# Patient Record
Sex: Male | Born: 1985 | Race: Black or African American | Hispanic: No | Marital: Single | Smoking: Current every day smoker
Health system: Southern US, Community
[De-identification: ages and names within clinical notes are randomized; demographics above are authoritative.]

---

## 2007-02-07 ENCOUNTER — Emergency Department (HOSPITAL_COMMUNITY): Admission: EM | Admit: 2007-02-07 | Discharge: 2007-02-07 | Payer: Self-pay | Admitting: Emergency Medicine

## 2010-03-05 ENCOUNTER — Emergency Department (HOSPITAL_COMMUNITY): Admission: EM | Admit: 2010-03-05 | Discharge: 2010-03-05 | Payer: Self-pay | Admitting: Emergency Medicine

## 2010-11-09 ENCOUNTER — Emergency Department (HOSPITAL_COMMUNITY)
Admission: EM | Admit: 2010-11-09 | Discharge: 2010-11-09 | Payer: Self-pay | Source: Home / Self Care | Admitting: Emergency Medicine

## 2010-11-17 ENCOUNTER — Emergency Department (HOSPITAL_COMMUNITY)
Admission: EM | Admit: 2010-11-17 | Discharge: 2010-11-17 | Payer: Self-pay | Source: Home / Self Care | Admitting: Emergency Medicine

## 2010-12-21 ENCOUNTER — Emergency Department (HOSPITAL_COMMUNITY)
Admission: EM | Admit: 2010-12-21 | Discharge: 2010-12-22 | Payer: Self-pay | Source: Home / Self Care | Admitting: Emergency Medicine

## 2010-12-22 LAB — GC/CHLAMYDIA PROBE AMP, GENITAL: Chlamydia, DNA Probe: NEGATIVE

## 2011-02-07 LAB — URINALYSIS, ROUTINE W REFLEX MICROSCOPIC
Bilirubin Urine: NEGATIVE
Glucose, UA: NEGATIVE mg/dL
Ketones, ur: NEGATIVE mg/dL
Nitrite: NEGATIVE
Specific Gravity, Urine: 1.024 (ref 1.005–1.030)
pH: 7.5 (ref 5.0–8.0)

## 2011-02-07 LAB — GC/CHLAMYDIA PROBE AMP, GENITAL: Chlamydia, DNA Probe: POSITIVE — AB

## 2011-06-20 ENCOUNTER — Observation Stay (HOSPITAL_COMMUNITY)
Admission: EM | Admit: 2011-06-20 | Discharge: 2011-06-20 | DRG: 040 | Disposition: A | Discharge status: Home / Self Care | Payer: BC Managed Care – PPO | Attending: Ophthalmology | Admitting: Ophthalmology

## 2011-06-20 ENCOUNTER — Emergency Department (HOSPITAL_COMMUNITY): Payer: BC Managed Care – PPO

## 2011-06-20 DIAGNOSIS — F1510 Other stimulant abuse, uncomplicated: Secondary | ICD-10-CM | POA: Insufficient documentation

## 2011-06-20 DIAGNOSIS — Y998 Other external cause status: Secondary | ICD-10-CM | POA: Insufficient documentation

## 2011-06-20 DIAGNOSIS — Y9229 Other specified public building as the place of occurrence of the external cause: Secondary | ICD-10-CM | POA: Insufficient documentation

## 2011-06-20 DIAGNOSIS — S01119A Laceration without foreign body of unspecified eyelid and periocular area, initial encounter: Principal | ICD-10-CM | POA: Insufficient documentation

## 2011-06-20 DIAGNOSIS — F17200 Nicotine dependence, unspecified, uncomplicated: Secondary | ICD-10-CM | POA: Insufficient documentation

## 2011-06-20 DIAGNOSIS — F1010 Alcohol abuse, uncomplicated: Secondary | ICD-10-CM | POA: Insufficient documentation

## 2011-06-20 DIAGNOSIS — Z01812 Encounter for preprocedural laboratory examination: Secondary | ICD-10-CM | POA: Insufficient documentation

## 2011-06-20 LAB — BASIC METABOLIC PANEL
Chloride: 107 mEq/L (ref 96–112)
GFR calc Af Amer: >60 mL/min (ref >60)
GFR calc non Af Amer: >60 mL/min (ref >60)
Glucose, Bld: 123 mg/dL — ABNORMAL HIGH (ref 70–99)
Potassium: 4 mEq/L (ref 3.5–5.1)
Sodium: 144 mEq/L (ref 135–145)

## 2011-06-20 LAB — CBC
HCT: 44.6 % (ref 39.0–52.0)
MCV: 84.3 fL (ref 78.0–100.0)
Platelets: 262 10*3/uL (ref 150–400)
RBC: 5.29 MIL/uL (ref 4.22–5.81)
RDW: 12.4 % (ref 11.5–15.5)
WBC: 18.7 10*3/uL — ABNORMAL HIGH (ref 4.0–10.5)

## 2011-06-20 LAB — PROTIME-INR: INR: 0.99 (ref 0.00–1.49)

## 2011-06-24 ENCOUNTER — Emergency Department (HOSPITAL_COMMUNITY)
Admission: EM | Admit: 2011-06-24 | Discharge: 2011-06-24 | Disposition: A | Discharge status: Home / Self Care | Payer: BC Managed Care – PPO | Attending: Emergency Medicine | Admitting: Emergency Medicine

## 2011-06-24 DIAGNOSIS — Z09 Encounter for follow-up examination after completed treatment for conditions other than malignant neoplasm: Secondary | ICD-10-CM | POA: Insufficient documentation

## 2011-06-24 DIAGNOSIS — H5789 Other specified disorders of eye and adnexa: Secondary | ICD-10-CM | POA: Insufficient documentation

## 2011-06-24 DIAGNOSIS — S01119A Laceration without foreign body of unspecified eyelid and periocular area, initial encounter: Secondary | ICD-10-CM | POA: Insufficient documentation

## 2011-06-24 DIAGNOSIS — X58XXXA Exposure to other specified factors, initial encounter: Secondary | ICD-10-CM | POA: Insufficient documentation

## 2011-06-29 NOTE — Op Note (Signed)
  NAME:  Lee Stark, Lee Stark NO.:  [id number]  MEDICAL RECORD NO.:  [id number]  LOCATION:  2550                         FACILITY:  [facility]  PHYSICIAN:  Shade Flood, MD       DATE OF BIRTH:  18-Jul-1986  DATE OF PROCEDURE:  06/20/2011 DATE OF DISCHARGE:                              OPERATIVE REPORT   PREOPERATIVE DIAGNOSIS:  Laceration of tarsal plate, right upper lid.  PROCEDURE PERFORMED:  Repair of tarsus and upper lid.  SURGEON:  Shade Flood, MD.  ESTIMATED BLOOD LOSS:  Less than 1 mL.  There were no complications.  No specimens for pathology.  The patient was prepared and draped in the usual fashion for ocular surgery on the right eye.  The anatomy of the lid was identified.  The levator aponeurosis was still attached to the superior margin of the tarsal plate, though there had been a shear-like tear through orbicularis with a full-length disruption of the tarsal plate at its medial aspect.  Interrupted 6-0 plain gut was used to reattach the tarsal plate and then orbicularis muscles were sewn together using a 6-0 plain gut.  After completing the orbicularis repair, subcutaneous tissue was then reapproximated using a mattress suture approach which was subcuticular and then the epithelium reapproximated nicely without surface on sutures.  The lid margin was sewn with one 6-0 nylon suture. The skin apposition was excellent, so I elected to use Steri-Strips for the superficial lid skin reapproximation.  The patient was given 100 mg of Ancef in the subconjunctival space under the upper lid and the patient's eye was patched using polymyxin and bacitracin ophthalmic ointment.  A plastic shield was placed and he was uneventfully extubated with stable vital signs and turned to the postoperative recovery area.          ______________________________ Shade Flood, MD     GG/MEDQ  D:  06/20/2011  T:  06/21/2011  Job:  [id number]  Electronically Signed by Shade Flood MD on 06/29/2011 11:58:51 AM

## 2012-05-01 ENCOUNTER — Encounter (HOSPITAL_COMMUNITY): Payer: Self-pay | Admitting: *Deleted

## 2012-05-01 ENCOUNTER — Emergency Department (HOSPITAL_COMMUNITY)
Admission: EM | Admit: 2012-05-01 | Discharge: 2012-05-01 | Disposition: A | Discharge status: Home / Self Care | Payer: BC Managed Care – PPO | Attending: Emergency Medicine | Admitting: Emergency Medicine

## 2012-05-01 DIAGNOSIS — F17200 Nicotine dependence, unspecified, uncomplicated: Secondary | ICD-10-CM | POA: Insufficient documentation

## 2012-05-01 DIAGNOSIS — R319 Hematuria, unspecified: Secondary | ICD-10-CM

## 2012-05-01 DIAGNOSIS — R109 Unspecified abdominal pain: Secondary | ICD-10-CM | POA: Insufficient documentation

## 2012-05-01 DIAGNOSIS — M549 Dorsalgia, unspecified: Secondary | ICD-10-CM

## 2012-05-01 LAB — URINALYSIS, ROUTINE W REFLEX MICROSCOPIC
Ketones, ur: 15 mg/dL — AB
Leukocytes, UA: NEGATIVE
Nitrite: NEGATIVE
Protein, ur: NEGATIVE mg/dL

## 2012-05-01 LAB — URINE MICROSCOPIC-ADD ON

## 2012-05-01 NOTE — ED Notes (Signed)
Pt d/c home in NAD. PT voiced understanding of d/c instructions and follow up care. Pt ambulated with quick, steady gait.  

## 2012-05-01 NOTE — Discharge Instructions (Signed)
Back Pain, Adult Low back pain is very common. About 1 in 5 people have back pain.The cause of low back pain is rarely dangerous. The pain often gets better over time.About half of people with a sudden onset of back pain feel better in just 2 weeks. About 8 in 10 people feel better by 6 weeks.  CAUSES Some common causes of back pain include:  Strain of the muscles or ligaments supporting the spine.   Wear and tear (degeneration) of the spinal discs.   Arthritis.   Direct injury to the back.  DIAGNOSIS Most of the time, the direct cause of low back pain is not known.However, back pain can be treated effectively even when the exact cause of the pain is unknown.Answering your caregiver's questions about your overall health and symptoms is one of the most accurate ways to make sure the cause of your pain is not dangerous. If your caregiver needs more information, he or she may order lab work or imaging tests (X-rays or MRIs).However, even if imaging tests show changes in your back, this usually does not require surgery. HOME CARE INSTRUCTIONS For many people, back pain returns.Since low back pain is rarely dangerous, it is often a condition that people can learn to manageon their own.   Remain active. It is stressful on the back to sit or stand in one place. Do not sit, drive, or stand in one place for more than 30 minutes at a time. Take short walks on level surfaces as soon as pain allows.Try to increase the length of time you walk each day.   Do not stay in bed.Resting more than 1 or 2 days can delay your recovery.   Do not avoid exercise or work.Your body is made to move.It is not dangerous to be active, even though your back may hurt.Your back will likely heal faster if you return to being active before your pain is gone.   Pay attention to your body when you bend and lift. Many people have less discomfortwhen lifting if they bend their knees, keep the load close to their  bodies,and avoid twisting. Often, the most comfortable positions are those that put less stress on your recovering back.   Find a comfortable position to sleep. Use a firm mattress and lie on your side with your knees slightly bent. If you lie on your back, put a pillow under your knees.   Only take over-the-counter or prescription medicines as directed by your caregiver. Over-the-counter medicines to reduce pain and inflammation are often the most helpful.Your caregiver may prescribe muscle relaxant drugs.These medicines help dull your pain so you can more quickly return to your normal activities and healthy exercise.   Put ice on the injured area.   Put ice in a plastic bag.   Place a towel between your skin and the bag.   Leave the ice on for 15 to 20 minutes, 3 to 4 times a day for the first 2 to 3 days. After that, ice and heat may be alternated to reduce pain and spasms.   Ask your caregiver about trying back exercises and gentle massage. This may be of some benefit.   Avoid feeling anxious or stressed.Stress increases muscle tension and can worsen back pain.It is important to recognize when you are anxious or stressed and learn ways to manage it.Exercise is a great option.  SEEK MEDICAL CARE IF:  You have pain that is not relieved with rest or medicine.   You have   pain that does not improve in 1 week.   You have new symptoms.   You are generally not feeling well.  SEEK IMMEDIATE MEDICAL CARE IF:   You have pain that radiates from your back into your legs.   You develop new bowel or bladder control problems.   You have unusual weakness or numbness in your arms or legs.   You develop nausea or vomiting.   You develop abdominal pain.   You feel faint.  Document Released: 11/14/2005 Document Revised: 11/03/2011 Document Reviewed: 04/04/2011 [employer] Patient Information 2012 [employer]. 

## 2012-05-01 NOTE — ED Notes (Signed)
Pt reports left sided flank pain that started approx 4-5 days ago, pt states noticed blood in urine this am. Pt in no distress, playing with phone. Reports very mild intermittent left flank pain.

## 2012-05-01 NOTE — ED Provider Notes (Addendum)
History  Scribed for [employer]. Bernette Mayers, MD, the patient was seen in room STRE5/STRE5. This chart was scribed by Candelaria Stagers. The patient's care started at 2:20 PM    CSN: [id number]  Arrival date & time 05/01/12  1320   First MD Initiated Contact with Patient 05/01/12 1359      Chief Complaint  Patient presents with  . Flank Pain  . Hematuria     HPI Lee Stark is a 26 y.o. male who presents to the Emergency Department complaining of sharp intermittent flank pain on the left side that started about four days ago.  Pt states that he also noticed blood on his penis this morning.  He denies dysuria or blood in urine.  Nothing seems to make the pain better or worse.    History reviewed. No pertinent past medical history.  History reviewed. No pertinent past surgical history.  History reviewed. No pertinent family history.  History  Substance Use Topics  . Smoking status: Current Everyday Smoker  . Smokeless tobacco: Not on file  . Alcohol Use: Yes     occ      Review of Systems  Gastrointestinal: Negative for abdominal pain.  Genitourinary: Positive for frequency, flank pain (left side) and discharge. Negative for dysuria and hematuria.  All other systems reviewed and are negative.    Allergies  Review of patient's allergies indicates no known allergies.  Home Medications  No current outpatient prescriptions on file.  BP 137/68  Pulse 78  Temp(Src) 98.4 F (36.9 C) (Oral)  Resp 20  SpO2 99%  Physical Exam  Nursing note and vitals reviewed. Constitutional: He is oriented to person, place, and time. He appears well-developed and well-nourished. No distress.  HENT:  Head: Normocephalic and atraumatic.  Eyes: EOM are normal. Pupils are equal, round, and reactive to light.  Neck: Neck supple. No tracheal deviation present.  Cardiovascular: Normal rate.   Pulmonary/Chest: Effort normal. No respiratory distress.  Abdominal: Soft. He exhibits no distension.         No CVA tenderness  Genitourinary:       Circumcised, No penile discharge, no blood at meatus. There is a small linear abrasion on the shaft of the penis that the patient states is from recent sex.   Musculoskeletal: Normal range of motion. He exhibits no edema.  Neurological: He is alert and oriented to person, place, and time.  Skin: Skin is warm and dry.  Psychiatric: He has a normal mood and affect. His behavior is normal.    ED Course  Procedures   DIAGNOSTIC STUDIES: Oxygen Saturation is 99% on room air, normal by my interpretation.    COORDINATION OF CARE:  2:51PM Ordered: Urinalysis, Routine w reflex microscopic     Labs Reviewed  URINALYSIS, ROUTINE W REFLEX MICROSCOPIC   No results found.   No diagnosis found.    MDM  L flank pain and reported hematuria. Pt is pain free now. Will check UA. GC/C probe for urine as well. Possibly a kidney stone vs urethritis.   I personally performed the services described in the documentation, which were scribed in my presence. The recorded information has been reviewed and considered.     3:16 PM  UA neg for infection. Small blood. Doubt large kidney stone. Pt states needs to leave. Advised we would call him if GC/C is positive.    Meridith Romick B. Bernette Mayers, MD 05/01/12 1455  Ninetta Adelstein B. Bernette Mayers, MD 05/01/12 1517
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
## 2012-09-03 ENCOUNTER — Emergency Department (HOSPITAL_COMMUNITY)
Admission: EM | Admit: 2012-09-03 | Discharge: 2012-09-03 | Disposition: A | Discharge status: Home / Self Care | Payer: Self-pay | Attending: Emergency Medicine | Admitting: Emergency Medicine

## 2012-09-03 ENCOUNTER — Encounter (HOSPITAL_COMMUNITY): Payer: Self-pay | Admitting: *Deleted

## 2012-09-03 DIAGNOSIS — R369 Urethral discharge, unspecified: Secondary | ICD-10-CM

## 2012-09-03 DIAGNOSIS — F17200 Nicotine dependence, unspecified, uncomplicated: Secondary | ICD-10-CM | POA: Insufficient documentation

## 2012-09-03 MED ORDER — CEFTRIAXONE SODIUM 250 MG IJ SOLR
250.0000 mg | Freq: Once | INTRAMUSCULAR | Status: AC
  Administered 2012-09-03: 250 mg via INTRAMUSCULAR
  Filled 2012-09-03: qty 250

## 2012-09-03 MED ORDER — AZITHROMYCIN 250 MG PO TABS
1000.0000 mg | ORAL_TABLET | Freq: Once | ORAL | Status: AC
  Administered 2012-09-03: 1000 mg via ORAL
  Filled 2012-09-03: qty 4

## 2012-09-03 NOTE — ED Provider Notes (Signed)
History   This chart was scribed for No att. providers found by Lynelle Smoke. The patient was seen in room TR05C/TR05C. Patient's care was started at 9:05PM.   CSN: [id number]  Arrival date & time 09/03/12  1737   None     Chief Complaint  Patient presents with  . Exposure to STD     The history is provided by the patient. No language interpreter was used.   Lee Stark is a 26 y.o. male who presents to the Emergency Department complaining of a cloudy discharge coming from the penis since yesterday. He reports of having unprotected sex with a new partner 4 days ago. He denies having prior episodes of same symptoms. He denies dysuria, penial pain, testicular pain, and rash.  History reviewed. No pertinent past medical history.  History reviewed. No pertinent past surgical history.  History reviewed. No pertinent family history.  History  Substance Use Topics  . Smoking status: Current Every Day Smoker  . Smokeless tobacco: Not on file  . Alcohol Use: Yes     occ      Review of Systems  Constitutional: Negative for fever and chills.  Gastrointestinal: Negative for nausea and vomiting.  Genitourinary: Positive for dysuria and difficulty urinating.    Allergies  Review of patient's allergies indicates no known allergies.  Home Medications  No current outpatient prescriptions on file.  Triage Vitals: BP 125/70  Pulse 93  Temp 98 F (36.7 C) (Oral)  Resp 16  SpO2 98%  Physical Exam  Nursing note and vitals reviewed. Constitutional: He is oriented to person, place, and time. He appears well-developed and well-nourished. No distress.  HENT:  Head: Normocephalic and atraumatic.  Eyes: EOM are normal.  Neck: Neck supple. No tracheal deviation present.  Cardiovascular: Normal rate.   Pulmonary/Chest: Effort normal. No respiratory distress.  Genitourinary:       No rash noted on penis or testicles. No discharge noticed. Specimen taken.  Musculoskeletal: Normal  range of motion.  Neurological: He is alert and oriented to person, place, and time.  Skin: Skin is warm and dry.  Psychiatric: He has a normal mood and affect. His behavior is normal.    ED Course  Procedures (including critical care time) DIAGNOSTIC STUDIES: Oxygen Saturation is 98% on room air, normal by my interpretation.    COORDINATION OF CARE: 9:05PM- Patient  informed of clinical course, understand medical decision-making process, and agree with plan.      Labs Reviewed  GC/CHLAMYDIA PROBE AMP, GENITAL - Abnormal; Notable for the following:    Chlamydia, DNA Probe POSITIVE (*)     All other components within normal limits  LAB REPORT - SCANNED   No results found.   1. Urethral discharge       MDM  I personally performed the services described in this documentation, which was scribed in my presence. The recorded information has been reviewed and considered.    Cultures sent.  Presumptively treated prior to discharge.  Pt counseled about safe sex practice.  Follow up at health department prn.        Gavin Pound. Shernell Saldierna, MD 09/09/12 1700

## 2012-09-03 NOTE — ED Notes (Signed)
The pt has had a penis discharge since yesterday

## 2012-09-04 LAB — GC/CHLAMYDIA PROBE AMP, GENITAL
Chlamydia, DNA Probe: POSITIVE — AB
GC Probe Amp, Genital: NEGATIVE

## 2012-09-05 NOTE — ED Notes (Signed)
+   Chlamydia Patient treated with Rocephin and Zithromax-[employer]HS letter faxed 

## 2012-09-08 ENCOUNTER — Telehealth (HOSPITAL_COMMUNITY): Payer: Self-pay | Admitting: Emergency Medicine

## 2012-09-09 ENCOUNTER — Encounter (HOSPITAL_COMMUNITY): Payer: Self-pay | Admitting: Emergency Medicine

## 2012-09-09 ENCOUNTER — Telehealth (HOSPITAL_COMMUNITY): Payer: Self-pay | Admitting: Emergency Medicine

## 2013-01-08 ENCOUNTER — Encounter: Payer: Self-pay | Admitting: Internal Medicine

## 2013-09-19 ENCOUNTER — Emergency Department (HOSPITAL_COMMUNITY): Payer: BC Managed Care – PPO

## 2013-09-19 ENCOUNTER — Emergency Department (HOSPITAL_COMMUNITY)
Admission: EM | Admit: 2013-09-19 | Discharge: 2013-09-19 | Disposition: A | Discharge status: Home / Self Care | Payer: Self-pay | Attending: Emergency Medicine | Admitting: Emergency Medicine

## 2013-09-19 ENCOUNTER — Encounter (HOSPITAL_COMMUNITY): Payer: Self-pay | Admitting: Emergency Medicine

## 2013-09-19 DIAGNOSIS — R0602 Shortness of breath: Secondary | ICD-10-CM | POA: Insufficient documentation

## 2013-09-19 DIAGNOSIS — Z87891 Personal history of nicotine dependence: Secondary | ICD-10-CM | POA: Insufficient documentation

## 2013-09-19 DIAGNOSIS — R0789 Other chest pain: Secondary | ICD-10-CM | POA: Insufficient documentation

## 2013-09-19 DIAGNOSIS — Z79899 Other long term (current) drug therapy: Secondary | ICD-10-CM | POA: Insufficient documentation

## 2013-09-19 DIAGNOSIS — J069 Acute upper respiratory infection, unspecified: Secondary | ICD-10-CM | POA: Insufficient documentation

## 2013-09-19 MED ORDER — ALBUTEROL SULFATE HFA 108 (90 BASE) MCG/ACT IN AERS: 2.0000 | INHALATION_SPRAY | RESPIRATORY_TRACT | Status: DC | PRN

## 2013-09-19 MED ORDER — PREDNISONE 20 MG PO TABS
60.0000 mg | ORAL_TABLET | Freq: Once | ORAL | Status: AC
  Administered 2013-09-19: 60 mg via ORAL
  Filled 2013-09-19: qty 3

## 2013-09-19 MED ORDER — ALBUTEROL SULFATE HFA 108 (90 BASE) MCG/ACT IN AERS
2.0000 | INHALATION_SPRAY | Freq: Once | RESPIRATORY_TRACT | Status: AC
  Administered 2013-09-19: 2 via RESPIRATORY_TRACT
  Filled 2013-09-19: qty 6.7

## 2013-09-19 MED ORDER — PREDNISONE 20 MG PO TABS: 60.0000 mg | ORAL_TABLET | Freq: Every day | ORAL | Status: DC

## 2013-09-19 NOTE — ED Notes (Signed)
Pt discharged.Vital signs stable and GCS 15.Discharge instruction given. 

## 2013-09-19 NOTE — ED Notes (Signed)
Pt reports having cold symptoms, cough and chest congestion for several days. Airway intact, no distress noted at triage.

## 2013-09-19 NOTE — ED Provider Notes (Signed)
CSN: [id number]     Arrival date & time 09/19/13  1756 History  This chart was scribed for non-physician practitioner Milus Mallick, PA-C, working with No att. providers found by Ronal Fear, ED scribe. This patient was seen in room TR09C/TR09C and the patient's care was started at 11:13 PM.    Chief Complaint  Patient presents with  . URI    The history is provided by the patient. No language interpreter was used.    HPI Comments: Lee Stark is a 27 y.o. male who presents to the Emergency Department complaining of sudden onset minimally productive cough, nasal/chest congestion, and chest tightness. Pt denies any modifying factors. Pt tried OTC medication with no relief. Denies any aggravating factors. Pt denies, Rhinorrhea, sore throat, asthma, fever or ear pain. Pt was a smoker, but quit 2x days ago. Pt does not appear to be in any acute distress with no other complaints. PERC negative.   History reviewed. No pertinent past medical history. History reviewed. No pertinent past surgical history. History reviewed. No pertinent family history. History  Substance Use Topics  . Smoking status: Current Every Day Smoker    Types: Cigarettes  . Smokeless tobacco: Not on file  . Alcohol Use: Yes     Comment: occ    Review of Systems  Constitutional: Negative for fever.  HENT: Positive for congestion. Negative for ear pain, rhinorrhea and sore throat.   Respiratory: Positive for cough.   All other systems reviewed and are negative.    Allergies  Review of patient's allergies indicates no known allergies.  Home Medications   Current Outpatient Rx  Name  Route  Sig  Dispense  Refill  . OVER THE COUNTER MEDICATION   Oral   Take 2 capsules by mouth daily as needed (cough / cold). "Cold & Flu"         . albuterol (PROVENTIL HFA;VENTOLIN HFA) 108 (90 BASE) MCG/ACT inhaler   Inhalation   Inhale 2 puffs into the lungs every 4 (four) hours as needed for wheezing or shortness  of breath.   1 Inhaler   1   . predniSONE (DELTASONE) 20 MG tablet   Oral   Take 3 tablets (60 mg total) by mouth daily.   12 tablet   0    BP 149/76  Pulse 89  Temp(Src) 99.7 F (37.6 C) (Oral)  Resp 18  SpO2 96% Physical Exam  Constitutional: He is oriented to person, place, and time. He appears well-developed and well-nourished. No distress.  HENT:  Head: Normocephalic and atraumatic.  Right Ear: External ear normal.  Left Ear: External ear normal.  Nose: Nose normal.  Mouth/Throat: Oropharynx is clear and moist.  Eyes: Conjunctivae are normal.  Neck: Normal range of motion. Neck supple.  Cardiovascular: Normal rate, regular rhythm and normal heart sounds.   Pulmonary/Chest: Effort normal and breath sounds normal. No stridor. No respiratory distress. He has no rales. He exhibits no tenderness.  Dry cough appreciated  Abdominal: Soft.  Musculoskeletal: Normal range of motion.  Lymphadenopathy:    He has no cervical adenopathy.  Neurological: He is alert and oriented to person, place, and time.  Skin: Skin is warm and dry. He is not diaphoretic.  Psychiatric: He has a normal mood and affect.    ED Course  Procedures (including critical care time) DIAGNOSTIC STUDIES: Oxygen Saturation is 96% on RA, normal by my interpretation.    COORDINATION OF CARE:    7:20 PM- Pt advised of plan for  treatment including breathing treatment and steroids and inhaler and pt agrees.   Labs Review Labs Reviewed - No data to display Imaging Review Dg Chest 2 View  09/19/2013   CLINICAL DATA:  Shortness of breath, cough, smoker  EXAM: CHEST  2 VIEW  COMPARISON:  None.  FINDINGS: The heart size and mediastinal contours are within normal limits. Both lungs are clear. The visualized skeletal structures are unremarkable.  IMPRESSION: No active cardiopulmonary disease.   Electronically Signed   By: Ruel Favors M.D.   On: 09/19/2013 19:13    EKG Interpretation   None       MDM    1. URI (upper respiratory infection)     Afebrile, NAD, non-toxic appearing, AAOx4. Pt CXR negative for acute infiltrate. Patients symptoms are consistent with URI, likely viral etiology. Discussed that antibiotics are not indicated for viral infections. Pt will be discharged with symptomatic treatment.  Verbalizes understanding and is agreeable with plan. Pt is hemodynamically stable & in NAD prior to dc. Return precautions discussed. Patient is agreeable to plan. Patient is stable at time of discharge     I personally performed the services described in this documentation, which was scribed in my presence. The recorded information has been reviewed and is accurate.     Jeannetta Ellis, PA-C 09/19/13 2313

## 2013-09-21 NOTE — ED Provider Notes (Signed)
Medical screening examination/treatment/procedure(s) were performed by non-physician practitioner and as supervising physician I was immediately available for consultation/collaboration.  EKG Interpretation   None         Joya Gaskins, MD 09/21/13 [phone]

## 2013-09-27 ENCOUNTER — Emergency Department (HOSPITAL_COMMUNITY)
Admission: EM | Admit: 2013-09-27 | Discharge: 2013-09-27 | Disposition: A | Discharge status: Home / Self Care | Payer: Self-pay | Attending: Emergency Medicine | Admitting: Emergency Medicine

## 2013-09-27 ENCOUNTER — Encounter (HOSPITAL_COMMUNITY): Payer: Self-pay | Admitting: Emergency Medicine

## 2013-09-27 DIAGNOSIS — F411 Generalized anxiety disorder: Secondary | ICD-10-CM | POA: Insufficient documentation

## 2013-09-27 DIAGNOSIS — F1490 Cocaine use, unspecified, uncomplicated: Secondary | ICD-10-CM

## 2013-09-27 DIAGNOSIS — F1510 Other stimulant abuse, uncomplicated: Secondary | ICD-10-CM | POA: Insufficient documentation

## 2013-09-27 DIAGNOSIS — F1410 Cocaine abuse, uncomplicated: Secondary | ICD-10-CM | POA: Insufficient documentation

## 2013-09-27 DIAGNOSIS — IMO0002 Reserved for concepts with insufficient information to code with codable children: Secondary | ICD-10-CM | POA: Insufficient documentation

## 2013-09-27 DIAGNOSIS — R002 Palpitations: Secondary | ICD-10-CM | POA: Insufficient documentation

## 2013-09-27 DIAGNOSIS — F17200 Nicotine dependence, unspecified, uncomplicated: Secondary | ICD-10-CM | POA: Insufficient documentation

## 2013-09-27 DIAGNOSIS — R42 Dizziness and giddiness: Secondary | ICD-10-CM | POA: Insufficient documentation

## 2013-09-27 LAB — CBC WITH DIFFERENTIAL/PLATELET
Basophils Absolute: 0 10*3/uL (ref 0.0–0.1)
Basophils Relative: 0 % (ref 0–1)
Eosinophils Relative: 0 % (ref 0–5)
HCT: 49.1 % (ref 39.0–52.0)
Hemoglobin: 17.8 g/dL — ABNORMAL HIGH (ref 13.0–17.0)
Lymphocytes Relative: 22 % (ref 12–46)
MCV: 84.9 fL (ref 78.0–100.0)
Monocytes Relative: 9 % (ref 3–12)
Neutro Abs: 11.6 10*3/uL — ABNORMAL HIGH (ref 1.7–7.7)
RBC: 5.78 MIL/uL (ref 4.22–5.81)
WBC: 16.8 10*3/uL — ABNORMAL HIGH (ref 4.0–10.5)

## 2013-09-27 LAB — BASIC METABOLIC PANEL
BUN: 7 mg/dL (ref 6–23)
CO2: 22 mEq/L (ref 19–32)
Chloride: 96 mEq/L (ref 96–112)
GFR calc Af Amer: >90 mL/min (ref >90)
Potassium: 3.1 mEq/L — ABNORMAL LOW (ref 3.5–5.1)

## 2013-09-27 LAB — RAPID URINE DRUG SCREEN, HOSP PERFORMED
Cocaine: POSITIVE — AB
Opiates: NOT DETECTED
Tetrahydrocannabinol: POSITIVE — AB

## 2013-09-27 MED ORDER — LORAZEPAM 2 MG/ML IJ SOLN
2.0000 mg | INTRAMUSCULAR | Status: DC | PRN
  Administered 2013-09-27: 2 mg via INTRAVENOUS
  Filled 2013-09-27: qty 1

## 2013-09-27 MED ORDER — SODIUM CHLORIDE 0.9 % IV BOLUS (SEPSIS)
1000.0000 mL | Freq: Once | INTRAVENOUS | Status: AC
  Administered 2013-09-27: 1000 mL via INTRAVENOUS

## 2013-09-27 MED ORDER — LORAZEPAM 2 MG/ML IJ SOLN
1.0000 mg | Freq: Once | INTRAMUSCULAR | Status: AC
  Administered 2013-09-27: 1 mg via INTRAVENOUS
  Filled 2013-09-27: qty 1

## 2013-09-27 MED ORDER — HALOPERIDOL LACTATE 5 MG/ML IJ SOLN
5.0000 mg | Freq: Once | INTRAMUSCULAR | Status: AC
  Administered 2013-09-27: 5 mg via INTRAMUSCULAR
  Filled 2013-09-27: qty 1

## 2013-09-27 NOTE — ED Notes (Signed)
Pt states he took 1/2 gram of molly about 10 hours ago, 1/2 of a pint bottle of liquor.

## 2013-09-27 NOTE — ED Notes (Signed)
EKG given to Dr. James  

## 2013-09-27 NOTE — ED Provider Notes (Signed)
Full history, ROS, physical examination available from Dr. Fayrene Fearing' note.  Physical Exam  BP 116/59  Pulse 97  Temp(Src) 98 F (36.7 C) (Oral)  Resp 18  SpO2 100%  Physical Exam  Constitutional: He is oriented to person, place, and time. He appears well-developed and well-nourished. No distress.  HENT:  Head: Normocephalic and atraumatic.  Right Ear: External ear normal.  Left Ear: External ear normal.  Nose: Nose normal.  Eyes: Conjunctivae are normal.  Neck: Normal range of motion. Neck supple.  Musculoskeletal: Normal range of motion.  Neurological: He is alert and oriented to person, place, and time. GCS eye subscore is 4. GCS verbal subscore is 5. GCS motor subscore is 6.  Skin: Skin is warm and dry. He is not diaphoretic.  Psychiatric: He has a normal mood and affect. His speech is normal. Cognition and memory are normal.    ED Course  Procedures  MDM Patient awaiting discharge once he becomes more awake. On re-examination patient conversing appropriately. He is alert, awake, and oriented and feels better for discharge. Patient gave verbal permission to discuss his reason for stay in ED with his mother. Return precautions discussed. Patient lectured on drug use. Patient is agreeable to plan. Patient is stable at time of discharge. Patient d/w with Dr. Fayrene Fearing, agrees with plan.          Jeannetta Ellis, PA-C 09/27/13 2146

## 2013-09-27 NOTE — ED Notes (Addendum)
Pt HR flutuateds between 80's to 130's

## 2013-09-27 NOTE — ED Provider Notes (Signed)
CSN: [id number]     Arrival date & time 09/27/13  1255 History   First MD Initiated Contact with Patient 09/27/13 1317     Chief Complaint  Patient presents with  . Shortness of Breath    HPI  Lee Stark states that about 10 hours ago he did some "Molly". He states that as far as he knows this is MDMA/Ecstasy.  He states he doesn't because it as "a club drug and it makes you feel good". This time it has not had its desired effect. States it makes him feel bad. He feels like his heart is racing. He feels like he has to keep breathing or "I'll die". He presents via EMS hyperventilating.  He denies pain  History reviewed. No pertinent past medical history. History reviewed. No pertinent past surgical history. No family history on file. History  Substance Use Topics  . Smoking status: Current Every Day Smoker    Types: Cigarettes  . Smokeless tobacco: Not on file  . Alcohol Use: Yes     Comment: occ    Review of Systems  Constitutional: Negative for fever, chills, diaphoresis, appetite change and fatigue.       Feels overall poorly. Lightheaded.  HENT: Negative for mouth sores, sore throat and trouble swallowing.   Eyes: Negative for visual disturbance.  Respiratory: Negative for cough, chest tightness, shortness of breath and wheezing.   Cardiovascular: Positive for palpitations. Negative for chest pain.  Gastrointestinal: Negative for nausea, vomiting, abdominal pain, diarrhea and abdominal distention.  Endocrine: Negative for polydipsia, polyphagia and polyuria.  Genitourinary: Negative for dysuria, frequency and hematuria.  Musculoskeletal: Negative for gait problem.  Skin: Negative for color change, pallor and rash.  Neurological: Positive for light-headedness. Negative for dizziness, syncope and headaches.  Hematological: Does not bruise/bleed easily.  Psychiatric/Behavioral: Negative for behavioral problems and confusion.    Allergies  Review of patient's allergies indicates  no known allergies.  Home Medications   Current Outpatient Rx  Name  Route  Sig  Dispense  Refill  . predniSONE (DELTASONE) 20 MG tablet   Oral   Take 3 tablets (60 mg total) by mouth daily.   12 tablet   0    BP 120/65  Pulse 89  Temp(Src) 98.6 F (37 C) (Oral)  Resp 29  SpO2 97% Physical Exam  Constitutional: He is oriented to person, place, and time.  Non-toxic appearance. He does not have a sickly appearance. He does not appear ill. No distress.  Hyperventilating. Appears anxious  HENT:  Head: Normocephalic.  Eyes: Conjunctivae are normal. Pupils are equal, round, and reactive to light. No scleral icterus.  Neck: Normal range of motion. Neck supple. No thyromegaly present.  Cardiovascular: Normal rate and regular rhythm.  Exam reveals no gallop and no friction rub.   No murmur heard. Resting heart rate in the 80s. He will begin to hyperventilate his heart rate into the 120s.  Pulmonary/Chest: Effort normal and breath sounds normal. No respiratory distress. He has no wheezes. He has no rales.  Clear  lungs  Abdominal: Soft. Bowel sounds are normal. He exhibits no distension. There is no tenderness. There is no rebound.  Musculoskeletal: Normal range of motion.  Neurological: He is alert and oriented to person, place, and time.  Skin: Skin is warm and dry. No rash noted.  Psychiatric: His mood appears anxious. Cognition and memory are not impaired. He expresses no suicidal ideation.  Anxious and hyperventilating.  Intentionally slow responses. Otherwise accurate. Oriented lucid.  ED Course  Procedures (including critical care time) Labs Review Labs Reviewed  CBC WITH DIFFERENTIAL - Abnormal; Notable for the following:    WBC 16.8 (*)    Hemoglobin 17.8 (*)    MCHC 36.3 (*)    Neutro Abs 11.6 (*)    Monocytes Absolute 1.5 (*)    All other components within normal limits  BASIC METABOLIC PANEL - Abnormal; Notable for the following:    Potassium 3.1 (*)     Glucose, Bld 123 (*)    GFR calc non Af Amer 86 (*)    All other components within normal limits  URINE RAPID DRUG SCREEN (HOSP PERFORMED) - Abnormal; Notable for the following:    Cocaine POSITIVE (*)    Amphetamines POSITIVE (*)    Tetrahydrocannabinol POSITIVE (*)    All other components within normal limits   Imaging Review No results found.  EKG Interpretation     Ventricular Rate:  86 PR Interval:  132 QRS Duration: 98 QT Interval:  384 QTC Calculation: 459 R Axis:   94 Text Interpretation:  Sinus rhythm Left ventricular hypertrophy Anterior J point , probably due to LVH            MDM   1. Cocaine use   2. Amphetamine user    He is reevaluated multiple times. Most recently at 1930 p.m. This is his ER stay he was getting IV benzodiazepines with Ativan and 2 in 1 mg increments with little effect. He still anxious. His given Haldol 5 mg IM. He has been sleeping and comfortable. Given 2 L of fluid. His vitals are stable. He is still somewhat sedated from the above medications. Upon awakening. He will be less anxious and able for discharge.  He shows no hemodynamic instability    Roney Marion, MD 09/27/13 1941

## 2013-09-27 NOTE — ED Notes (Signed)
Pt continuing to hit the call bell multiple times, pt is very anxious.

## 2013-09-27 NOTE — ED Notes (Signed)
Pt continues to be very anxious.

## 2013-09-27 NOTE — ED Notes (Signed)
Per ems, pt was [place] the [place] station [place], started getting SOB. Upon EMS arrival, pt was 130s HR, SOB, and diaphoretic, sats 90s. Pt placed on 2L o2. About 10 hours ago, pt took [place], also drank alcohol several hours ago. 18 L AC, saline locked. Was previously diagnosed with lung infection.

## 2013-09-28 NOTE — ED Provider Notes (Signed)
Medical screening examination/treatment/procedure(s) were performed by non-physician practitioner and as supervising physician I was immediately available for consultation/collaboration.  EKG Interpretation     Ventricular Rate:  86 PR Interval:  132 QRS Duration: 98 QT Interval:  384 QTC Calculation: 459 R Axis:   94 Text Interpretation:  Sinus rhythm Left ventricular hypertrophy Anterior J point , probably due to LVH              Junius Argyle, MD 09/28/13 1103

## 2013-11-19 ENCOUNTER — Emergency Department (HOSPITAL_COMMUNITY)
Admission: EM | Admit: 2013-11-19 | Discharge: 2013-11-19 | Disposition: A | Discharge status: Home / Self Care | Payer: Self-pay | Attending: Emergency Medicine | Admitting: Emergency Medicine

## 2013-11-19 ENCOUNTER — Encounter (HOSPITAL_COMMUNITY): Payer: Self-pay | Admitting: Emergency Medicine

## 2013-11-19 ENCOUNTER — Emergency Department (HOSPITAL_COMMUNITY): Payer: Self-pay

## 2013-11-19 DIAGNOSIS — R110 Nausea: Secondary | ICD-10-CM | POA: Insufficient documentation

## 2013-11-19 DIAGNOSIS — Z87891 Personal history of nicotine dependence: Secondary | ICD-10-CM | POA: Insufficient documentation

## 2013-11-19 DIAGNOSIS — R197 Diarrhea, unspecified: Secondary | ICD-10-CM | POA: Insufficient documentation

## 2013-11-19 DIAGNOSIS — R0602 Shortness of breath: Secondary | ICD-10-CM | POA: Insufficient documentation

## 2013-11-19 DIAGNOSIS — J069 Acute upper respiratory infection, unspecified: Secondary | ICD-10-CM | POA: Insufficient documentation

## 2013-11-19 DIAGNOSIS — R509 Fever, unspecified: Secondary | ICD-10-CM | POA: Insufficient documentation

## 2013-11-19 DIAGNOSIS — Z79899 Other long term (current) drug therapy: Secondary | ICD-10-CM | POA: Insufficient documentation

## 2013-11-19 DIAGNOSIS — J45909 Unspecified asthma, uncomplicated: Secondary | ICD-10-CM | POA: Insufficient documentation

## 2013-11-19 DIAGNOSIS — R0789 Other chest pain: Secondary | ICD-10-CM | POA: Insufficient documentation

## 2013-11-19 LAB — COMPREHENSIVE METABOLIC PANEL
AST: 21 U/L (ref 0–37)
Albumin: 4.2 g/dL (ref 3.5–5.2)
Alkaline Phosphatase: 83 U/L (ref 39–117)
BUN: 17 mg/dL (ref 6–23)
CO2: 25 mEq/L (ref 19–32)
Calcium: 9.6 mg/dL (ref 8.4–10.5)
Chloride: 104 mEq/L (ref 96–112)
Creatinine, Ser: 1.27 mg/dL (ref 0.50–1.35)
GFR calc non Af Amer: 77 mL/min — ABNORMAL LOW (ref >90)
Total Bilirubin: 0.3 mg/dL (ref 0.3–1.2)

## 2013-11-19 LAB — CBC WITH DIFFERENTIAL/PLATELET
Eosinophils Absolute: 0.1 10*3/uL (ref 0.0–0.7)
Lymphocytes Relative: 28 % (ref 12–46)
Lymphs Abs: 2.9 10*3/uL (ref 0.7–4.0)
Monocytes Relative: 7 % (ref 3–12)
Neutro Abs: 6.6 10*3/uL (ref 1.7–7.7)
Neutrophils Relative %: 63 % (ref 43–77)
Platelets: 280 10*3/uL (ref 150–400)
RBC: 5.2 MIL/uL (ref 4.22–5.81)
WBC: 10.4 10*3/uL (ref 4.0–10.5)

## 2013-11-19 LAB — URINALYSIS, ROUTINE W REFLEX MICROSCOPIC
Glucose, UA: NEGATIVE mg/dL
Hgb urine dipstick: NEGATIVE
Ketones, ur: NEGATIVE mg/dL
Leukocytes, UA: NEGATIVE
Protein, ur: NEGATIVE mg/dL
pH: 7.5 (ref 5.0–8.0)

## 2013-11-19 LAB — POCT I-STAT TROPONIN I: Troponin i, poc: 0 ng/mL (ref 0.00–0.08)

## 2013-11-19 MED ORDER — IPRATROPIUM BROMIDE 0.02 % IN SOLN
0.5000 mg | Freq: Once | RESPIRATORY_TRACT | Status: AC
  Administered 2013-11-19: 0.5 mg via RESPIRATORY_TRACT
  Filled 2013-11-19: qty 2.5

## 2013-11-19 MED ORDER — GUAIFENESIN 100 MG/5ML PO LIQD: 100.0000 mg | ORAL | Status: DC | PRN

## 2013-11-19 MED ORDER — ALBUTEROL SULFATE (5 MG/ML) 0.5% IN NEBU
5.0000 mg | INHALATION_SOLUTION | Freq: Once | RESPIRATORY_TRACT | Status: AC
  Administered 2013-11-19: 5 mg via RESPIRATORY_TRACT
  Filled 2013-11-19: qty 1

## 2013-11-19 MED ORDER — PREDNISONE 20 MG PO TABS: 60.0000 mg | ORAL_TABLET | Freq: Every day | ORAL | Status: DC

## 2013-11-19 MED ORDER — ALBUTEROL SULFATE HFA 108 (90 BASE) MCG/ACT IN AERS
2.0000 | INHALATION_SPRAY | Freq: Once | RESPIRATORY_TRACT | Status: AC
  Administered 2013-11-19: 2 via RESPIRATORY_TRACT
  Filled 2013-11-19: qty 6.7

## 2013-11-19 MED ORDER — ALBUTEROL SULFATE HFA 108 (90 BASE) MCG/ACT IN AERS: 2.0000 | INHALATION_SPRAY | RESPIRATORY_TRACT | Status: AC | PRN

## 2013-11-19 MED ORDER — PREDNISONE 20 MG PO TABS
60.0000 mg | ORAL_TABLET | Freq: Once | ORAL | Status: AC
  Administered 2013-11-19: 60 mg via ORAL
  Filled 2013-11-19: qty 3

## 2013-11-19 NOTE — ED Notes (Signed)
Pt is here with chest pain, sob, chills, sweats, diarrhea for one week.  Pt states coughing up green sputum that has turned rust color

## 2013-11-19 NOTE — ED Provider Notes (Signed)
CSN: [id number]     Arrival date & time 11/19/13  1007 History   First MD Initiated Contact with Patient 11/19/13 1113     Chief Complaint  Patient presents with  . Chest Pain  . Shortness of Breath   (Consider location/radiation/quality/duration/timing/severity/associated sxs/prior Treatment) HPI Comments: Patient is a 27 yo M presenting to the ED for two months of moderate chest tightness, shortness of breath, subjective fevers and chills, productive cough with green sputum with occasional scant rust color mucus. Patient states he was seen a couple weeks ago a hospital in [place] and diagnosed with asthma. He states his symptoms improved after being given an inhaler and prednisone. He states once he ran out of his inhaler and his prednisone his symptoms returned. He states he no longer smokes tobacco as he felt that was exacerbating his symptoms. Patient is also complaining of occasional non-bloody loose stools w/o associated abdominal pain. He states he quit smoking around a month ago. Patient denies any recent hospitalizations or travel. PERC negative.    History reviewed. No pertinent past medical history. History reviewed. No pertinent past surgical history. No family history on file. History  Substance Use Topics  . Smoking status: Current Every Day Smoker    Types: Cigarettes  . Smokeless tobacco: Not on file  . Alcohol Use: Yes     Comment: occ    Review of Systems  Constitutional: Negative for fever.  Respiratory: Positive for cough and chest tightness. Negative for wheezing and stridor.   Cardiovascular: Negative for chest pain and leg swelling.  Gastrointestinal: Positive for nausea and diarrhea. Negative for abdominal pain.  Neurological: Negative for syncope, light-headedness and headaches.  All other systems reviewed and are negative.    Allergies  Review of patient's allergies indicates no known allergies.  Home Medications   Current Outpatient Rx  Name   Route  Sig  Dispense  Refill  . albuterol (PROVENTIL HFA;VENTOLIN HFA) 108 (90 BASE) MCG/ACT inhaler   Inhalation   Inhale 2 puffs into the lungs every 6 (six) hours as needed for wheezing or shortness of breath.         . Multiple Vitamin (MULTIVITAMIN WITH MINERALS) TABS tablet   Oral   Take 1 tablet by mouth daily.         . Omega-3 Fatty Acids (FISH OIL PO)   Oral   Take 1 tablet by mouth daily.         . predniSONE (DELTASONE) 20 MG tablet   Oral   Take 3 tablets (60 mg total) by mouth daily.   12 tablet   0   . albuterol (PROVENTIL HFA;VENTOLIN HFA) 108 (90 BASE) MCG/ACT inhaler   Inhalation   Inhale 2 puffs into the lungs every 4 (four) hours as needed for wheezing or shortness of breath (cough).   1 Inhaler   0   . guaiFENesin (ROBITUSSIN) 100 MG/5ML liquid   Oral   Take 5-10 mLs (100-200 mg total) by mouth every 4 (four) hours as needed for cough.   60 mL   0   . predniSONE (DELTASONE) 20 MG tablet   Oral   Take 3 tablets (60 mg total) by mouth daily.   15 tablet   0    BP 142/69  Pulse 77  Temp(Src) 98.6 F (37 C) (Oral)  Resp 20  Wt 200 lb (90.719 kg)  SpO2 100% Physical Exam  Constitutional: He is oriented to person, place, and time. He appears well-developed and  well-nourished.  HENT:  Head: Normocephalic and atraumatic.  Right Ear: External ear normal.  Left Ear: External ear normal.  Nose: Nose normal.  Mouth/Throat: Oropharynx is clear and moist. No oropharyngeal exudate.  Eyes: Conjunctivae are normal.  Neck: Normal range of motion. Neck supple.  Cardiovascular: Normal rate, regular rhythm and normal heart sounds.   Pulmonary/Chest: Effort normal and breath sounds normal. No respiratory distress. He has no rales. He exhibits tenderness.  Abdominal: Soft. Bowel sounds are normal. He exhibits no distension and no mass. There is no tenderness. There is no rebound and no guarding.  Musculoskeletal: Normal range of motion. He exhibits no  edema.  Lymphadenopathy:    He has no cervical adenopathy.  Neurological: He is alert and oriented to person, place, and time.  Skin: Skin is warm and dry.    ED Course  Procedures (including critical care time) Medications  albuterol (PROVENTIL) (5 MG/ML) 0.5% nebulizer solution 5 mg (5 mg Nebulization Given 11/19/13 1210)  ipratropium (ATROVENT) nebulizer solution 0.5 mg (0.5 mg Nebulization Given 11/19/13 1210)  albuterol (PROVENTIL HFA;VENTOLIN HFA) 108 (90 BASE) MCG/ACT inhaler 2 puff (2 puffs Inhalation Given 11/19/13 1351)  predniSONE (DELTASONE) tablet 60 mg (60 mg Oral Given 11/19/13 1352)    Labs Review Labs Reviewed  COMPREHENSIVE METABOLIC PANEL - Abnormal; Notable for the following:    GFR calc non Af Amer 77 (*)    GFR calc Af Amer 89 (*)    All other components within normal limits  CBC WITH DIFFERENTIAL  URINALYSIS, ROUTINE W REFLEX MICROSCOPIC  POCT I-STAT TROPONIN I   Imaging Review Dg Chest 2 View  11/19/2013   CLINICAL DATA:  Cough, fever, shortness of Breath  EXAM: CHEST  2 VIEW  COMPARISON:  09/19/2013  FINDINGS: The heart size and mediastinal contours are within normal limits. Both lungs are clear. The visualized skeletal structures are unremarkable.  IMPRESSION: No active cardiopulmonary disease.   Electronically Signed   By: Natasha Mead M.D.   On: 11/19/2013 10:47    EKG Interpretation   None       MDM   1. URI (upper respiratory infection)   2. Asthma     1:49 PM Patient's symptoms improved after nebulizer treatment. Patient states he is feeling much better and wishes to go home now.   Afebrile, NAD, non-toxic appearing, AAOx4. Patient is to be discharged with recommendation to follow up with PCP in regards to today's hospital visit. Chest pain is not likely of cardiac or serious pulmonary etiology d/t presentation, perc negative, VSS, no tracheal deviation, no JVD or new murmur, RRR, breath sounds equal bilaterally, EKG without acute  abnormalities, negative troponin, and negative CXR. CP reproducible and likely related to asthma exacerbation w/ URI symptoms. Breathing improved after nebulizer. Pt has been advised start inhaler q2h and will be given Prednisone burst. Advised to return to the ED is CP becomes exertional, associated with diaphoresis or nausea, radiates to left jaw/arm, worsens or becomes concerning in any way. Abdomen soft, non-tender, non-distended. No concern for acute abdominal etiology of occasional diarrhea. Case has been discussed with Dr. Loretha Stapler who agrees with the above plan to discharge.      Jeannetta Ellis, PA-C 11/19/13 1956

## 2013-11-22 NOTE — ED Provider Notes (Signed)
Medical screening examination/treatment/procedure(s) were performed by non-physician practitioner and as supervising physician I was immediately available for consultation/collaboration.  EKG Interpretation    Date/Time:  Tuesday November 19 2013 10:10:01 EST Ventricular Rate:  87 PR Interval:  132 QRS Duration: 96 QT Interval:  362 QTC Calculation: 435 R Axis:   88 Text Interpretation:  Normal sinus rhythm nonspecific ST changes, likely early repolarization pattern No significant change was found Confirmed by [facility]  MD, TREY ([id number]) on 11/19/2013 2:01:36 PM              Candyce Churn, MD 11/22/13 [phone]

## 2013-12-02 ENCOUNTER — Emergency Department (HOSPITAL_COMMUNITY)
Admission: EM | Admit: 2013-12-02 | Discharge: 2013-12-02 | Disposition: A | Discharge status: Home / Self Care | Payer: Self-pay | Attending: Emergency Medicine | Admitting: Emergency Medicine

## 2013-12-02 ENCOUNTER — Encounter (HOSPITAL_COMMUNITY): Payer: Self-pay | Admitting: Emergency Medicine

## 2013-12-02 DIAGNOSIS — Z79899 Other long term (current) drug therapy: Secondary | ICD-10-CM | POA: Insufficient documentation

## 2013-12-02 DIAGNOSIS — R059 Cough, unspecified: Secondary | ICD-10-CM | POA: Insufficient documentation

## 2013-12-02 DIAGNOSIS — R05 Cough: Secondary | ICD-10-CM

## 2013-12-02 DIAGNOSIS — F17200 Nicotine dependence, unspecified, uncomplicated: Secondary | ICD-10-CM | POA: Insufficient documentation

## 2013-12-02 DIAGNOSIS — J3489 Other specified disorders of nose and nasal sinuses: Secondary | ICD-10-CM | POA: Insufficient documentation

## 2013-12-02 DIAGNOSIS — R0981 Nasal congestion: Secondary | ICD-10-CM

## 2013-12-02 MED ORDER — PREDNISONE 20 MG PO TABS: 20.0000 mg | ORAL_TABLET | Freq: Every day | ORAL | Status: DC

## 2013-12-02 NOTE — ED Notes (Signed)
Pt sts was seen here and diagnosed with URI and filled his prednisone prescription, sts he took one day and then lost the meds, needs refill.

## 2013-12-02 NOTE — Discharge Instructions (Signed)
If you were given medicines take as directed.  If you are on coumadin or contraceptives realize their levels and effectiveness is altered by many different medicines.  If you have any reaction (rash, tongues swelling, other) to the medicines stop taking and see a physician.   °Please follow up as directed and return to the ER or see a physician for new or worsening symptoms.  Thank you. ° ° °

## 2013-12-02 NOTE — ED Provider Notes (Signed)
CSN: [id number]     Arrival date & time 12/02/13  1410 History   First MD Initiated Contact with Patient 12/02/13 1633     Chief Complaint  Patient presents with  . Medication Refill   (Consider location/radiation/quality/duration/timing/severity/associated sxs/prior Treatment) HPI Comments: 28 yo with no medical hx, smoker presents with cough and congestion, recent visit and lost script for prednisone, pt feels improved, cough improved, no lung dz or new concerns.  Nothing worsens.No sick contacts.  The history is provided by the patient.    History reviewed. No pertinent past medical history. History reviewed. No pertinent past surgical history. History reviewed. No pertinent family history. History  Substance Use Topics  . Smoking status: Current Every Day Smoker    Types: Cigarettes  . Smokeless tobacco: Not on file  . Alcohol Use: Yes     Comment: occ    Review of Systems  Constitutional: Negative for fever and chills.  HENT: Positive for congestion.   Eyes: Negative for visual disturbance.  Respiratory: Positive for cough. Negative for shortness of breath.   Cardiovascular: Negative for chest pain.  Gastrointestinal: Negative for vomiting and abdominal pain.  Musculoskeletal: Negative for back pain, neck pain and neck stiffness.  Skin: Negative for rash.  Neurological: Negative for light-headedness and headaches.    Allergies  Review of patient's allergies indicates no known allergies.  Home Medications   Current Outpatient Rx  Name  Route  Sig  Dispense  Refill  . albuterol (PROVENTIL HFA;VENTOLIN HFA) 108 (90 BASE) MCG/ACT inhaler   Inhalation   Inhale 2 puffs into the lungs every 4 (four) hours as needed for wheezing or shortness of breath (cough).   1 Inhaler   0   . Multiple Vitamin (MULTIVITAMIN WITH MINERALS) TABS tablet   Oral   Take 1 tablet by mouth daily.         . predniSONE (DELTASONE) 20 MG tablet   Oral   Take 3 tablets (60 mg total) by  mouth daily.   15 tablet   0   . predniSONE (DELTASONE) 20 MG tablet   Oral   Take 1 tablet (20 mg total) by mouth daily with breakfast.   4 tablet   0    BP 128/82  Pulse 72  Temp(Src) 98 F (36.7 C) (Oral)  Resp 18  Ht 6' (1.829 m)  Wt 203 lb (92.08 kg)  BMI 27.53 kg/m2  SpO2 98% Physical Exam  Nursing note and vitals reviewed. Constitutional: He is oriented to person, place, and time. He appears well-developed and well-nourished.  HENT:  Head: Normocephalic and atraumatic.  Eyes: Conjunctivae are normal. Right eye exhibits no discharge. Left eye exhibits no discharge.  Neck: Normal range of motion. Neck supple. No tracheal deviation present.  Cardiovascular: Normal rate and regular rhythm.   Pulmonary/Chest: Effort normal and breath sounds normal.  Abdominal: Soft.  Musculoskeletal: He exhibits no edema.  Neurological: He is alert and oriented to person, place, and time.  Skin: Skin is warm. No rash noted.  Psychiatric: He has a normal mood and affect.    ED Course  Procedures (including critical care time) Labs Review Labs Reviewed - No data to display Imaging Review No results found.  EKG Interpretation   None       MDM   1. Congestion of nasal sinus   2. Cough    Well appearing.  No red flags. URI. Script given for prednisone for a few days, pt has albuterol prn.  Results and differential diagnosis were discussed with the patient. Close follow up outpatient was discussed, patient comfortable with the plan.   Diagnosis: above    Enid SkeensJoshua M Mikya Don, MD 12/02/13 [phone]

## 2013-12-11 ENCOUNTER — Emergency Department (HOSPITAL_COMMUNITY)
Admission: EM | Admit: 2013-12-11 | Discharge: 2013-12-11 | Disposition: A | Discharge status: Home / Self Care | Payer: Self-pay | Attending: Emergency Medicine | Admitting: Emergency Medicine

## 2013-12-11 DIAGNOSIS — F17200 Nicotine dependence, unspecified, uncomplicated: Secondary | ICD-10-CM | POA: Insufficient documentation

## 2013-12-11 DIAGNOSIS — J45909 Unspecified asthma, uncomplicated: Secondary | ICD-10-CM

## 2013-12-11 DIAGNOSIS — J45901 Unspecified asthma with (acute) exacerbation: Secondary | ICD-10-CM | POA: Insufficient documentation

## 2013-12-11 DIAGNOSIS — Z79899 Other long term (current) drug therapy: Secondary | ICD-10-CM | POA: Insufficient documentation

## 2013-12-11 MED ORDER — PREDNISONE 20 MG PO TABS
60.0000 mg | ORAL_TABLET | Freq: Once | ORAL | Status: AC
  Administered 2013-12-11: 60 mg via ORAL
  Filled 2013-12-11: qty 3

## 2013-12-11 MED ORDER — PREDNISONE 20 MG PO TABS: 60.0000 mg | ORAL_TABLET | Freq: Every day | ORAL | Status: DC

## 2013-12-11 MED ORDER — ALBUTEROL SULFATE HFA 108 (90 BASE) MCG/ACT IN AERS
2.0000 | INHALATION_SPRAY | Freq: Once | RESPIRATORY_TRACT | Status: AC
  Administered 2013-12-11: 2 via RESPIRATORY_TRACT
  Filled 2013-12-11: qty 6.7

## 2013-12-11 NOTE — Discharge Instructions (Signed)
Asthma Attack Prevention °Although there is no way to prevent asthma from starting, you can take steps to control the disease and reduce its symptoms. Learn about your asthma and how to control it. Take an active role to control your asthma by working with your health care provider to create and follow an asthma action plan. An asthma action plan guides you in: °· Taking your medicines properly. °· Avoiding things that set off your asthma or make your asthma worse (asthma triggers). °· Tracking your level of asthma control. °· Responding to worsening asthma. °· Seeking emergency care when needed. °To track your asthma, keep records of your symptoms, check your peak flow number using a handheld device that shows how well air moves out of your lungs (peak flow meter), and get regular asthma checkups.  °WHAT ARE SOME WAYS TO PREVENT AN ASTHMA ATTACK? °· Take medicines as directed by your health care provider. °· Keep track of your asthma symptoms and level of control. °· With your health care provider, write a detailed plan for taking medicines and managing an asthma attack. Then be sure to follow your action plan. Asthma is an ongoing condition that needs regular monitoring and treatment. °· Identify and avoid asthma triggers. Many outdoor allergens and irritants (such as pollen, mold, cold air, and air pollution) can trigger asthma attacks. Find out what your asthma triggers are and take steps to avoid them. °· Monitor your breathing. Learn to recognize warning signs of an attack, such as coughing, wheezing, or shortness of breath. Your lung function may decrease before you notice any signs or symptoms, so regularly measure and record your peak airflow with a home peak flow meter. °· Identify and treat attacks early. If you act quickly, you are less likely to have a severe attack. You will also need less medicine to control your symptoms. When your peak flow measurements decrease and alert you to an upcoming attack,  take your medicine as instructed and immediately stop any activity that may have triggered the attack. If your symptoms do not improve, get medical help. °· Pay attention to increasing quick-relief inhaler use. If you find yourself relying on your quick-relief inhaler, your asthma is not under control. See your health care provider about adjusting your treatment. °WHAT CAN MAKE MY SYMPTOMS WORSE? °A number of common things can set off or make your asthma symptoms worse and cause temporary increased inflammation of your airways. Keep track of your asthma symptoms for several weeks, detailing all the environmental and emotional factors that are linked with your asthma. When you have an asthma attack, go back to your asthma diary to see which factor, or combination of factors, might have contributed to it. Once you know what these factors are, you can take steps to control many of them. If you have allergies and asthma, it is important to take asthma prevention steps at home. Minimizing contact with the substance to which you are allergic will help prevent an asthma attack. Some triggers and ways to avoid these triggers are: °Animal Dander:  °Some people are allergic to the flakes of skin or dried saliva from animals with fur or feathers.  °· There is no such thing as a hypoallergenic dog or cat breed. All dogs or cats can cause allergies, even if they don't shed. °· Keep these pets out of your home. °· If you are not able to keep a pet outdoors, keep the pet out of your bedroom and other sleeping areas at all   times, and keep the door closed. °· Remove carpets and furniture covered with cloth from your home. If that is not possible, keep the pet away from fabric-covered furniture and carpets. °Dust Mites: °Many people with asthma are allergic to dust mites. Dust mites are tiny bugs that are found in every home in mattresses, pillows, carpets, fabric-covered furniture, bedcovers, clothes, stuffed toys, and other  fabric-covered items.  °· Cover your mattress in a special dust-proof cover. °· Cover your pillow in a special dust-proof cover, or wash the pillow each week in hot water. Water must be hotter than 130° F (54.4° C) to kill dust mites. Cold or warm water used with detergent and bleach can also be effective. °· Wash the sheets and blankets on your bed each week in hot water. °· Try not to sleep or lie on cloth-covered cushions. °· Call ahead when traveling and ask for a smoke-free hotel room. Bring your own bedding and pillows in case the hotel only supplies feather pillows and down comforters, which may contain dust mites and cause asthma symptoms. °· Remove carpets from your bedroom and those laid on concrete, if you can. °· Keep stuffed toys out of the bed, or wash the toys weekly in hot water or cooler water with detergent and bleach. °Cockroaches: °Many people with asthma are allergic to the droppings and remains of cockroaches.  °· Keep food and garbage in closed containers. Never leave food out. °· Use poison baits, traps, powders, gels, or paste (for example, boric acid). °· If a spray is used to kill cockroaches, stay out of the room until the odor goes away. °Indoor Mold: °· Fix leaky faucets, pipes, or other sources of water that have mold around them. °· Clean floors and moldy surfaces with a fungicide or diluted bleach. °· Avoid using humidifiers, vaporizers, or swamp coolers. These can spread molds through the air. °Pollen and Outdoor Mold: °· When pollen or mold spore counts are high, try to keep your windows closed. °· Stay indoors with windows closed from late morning to afternoon. Pollen and some mold spore counts are highest at that time. °· Ask your health care provider whether you need to take anti-inflammatory medicine or increase your dose of the medicine before your allergy season starts. °Other Irritants to Avoid: °· Tobacco smoke is an irritant. If you smoke, ask your health care provider how  you can quit. Ask family members to quit smoking too. Do not allow smoking in your home or car. °· If possible, do not use a wood-burning stove, kerosene heater, or fireplace. Minimize exposure to all sources of smoke, including to incense, candles, fires, and fireworks. °· Try to stay away from strong odors and sprays, such as perfume, talcum powder, hair spray, and paints. °· Decrease humidity in your home and use an indoor air cleaning device. Reduce indoor humidity to below 60%. Dehumidifiers or central air conditioners can do this. °· Decrease house dust exposure by changing furnace and air cooler filters frequently. °· Try to have someone else vacuum for you once or twice a week. Stay out of rooms while they are being vacuumed and for a short while afterward. °· If you vacuum, use a dust mask from a hardware store, a double-layered or microfilter vacuum cleaner bag, or a vacuum cleaner with a HEPA filter. °· Sulfites in foods and beverages can be irritants. Do not drink beer or wine or eat dried fruit, processed potatoes, or shrimp if they cause asthma symptoms. °·   Cold air can trigger an asthma attack. Cover your nose and mouth with a scarf on cold or windy days. °· Several health conditions can make asthma more difficult to manage, including a runny nose, sinus infections, reflux disease, psychological stress, and sleep apnea. Work with your health care provider to manage these conditions. °· Avoid close contact with people who have a respiratory infection such as a cold or the flu, since your asthma symptoms may get worse if you catch the infection. Wash your hands thoroughly after touching items that may have been handled by people with a respiratory infection. °· Get a flu shot every year to protect against the flu virus, which often makes asthma worse for days or weeks. Also get a pneumonia shot if you have not previously had one. Unlike the flu shot, the pneumonia shot does not need to be given  yearly. °Medicines: °· Talk to your health care provider about whether it is safe for you to take aspirin or non-steroidal anti-inflammatory medicines (NSAIDs). In a small number of people with asthma, aspirin and NSAIDs can cause asthma attacks. These medicines must be avoided by people who have known aspirin-sensitive asthma. It is important that people with aspirin-sensitive asthma read labels of all over-the-counter medicines used to treat pain, colds, coughs, and fever. °· Beta blockers and ACE inhibitors are other medicines you should discuss with your health care provider. °HOW CAN I FIND OUT WHAT I AM ALLERGIC TO? °Ask your asthma health care provider about allergy skin testing or blood testing (the RAST test) to identify the allergens to which you are sensitive. If you are found to have allergies, the most important thing to do is to try to avoid exposure to any allergens that you are sensitive to as much as possible. Other treatments for allergies, such as medicines and allergy shots (immunotherapy) are available.  °CAN I EXERCISE? °Follow your health care provider's advice regarding asthma treatment before exercising. It is important to maintain a regular exercise program, but vigorous exercise, or exercise in cold, humid, or dry environments can cause asthma attacks, especially for those people who have exercise-induced asthma. °Document Released: 11/02/2009 Document Revised: 07/17/2013 Document Reviewed: 05/22/2013 °[employer]® Patient Information ©2014 [employer]. ° °Bronchospasm, Adult °A bronchospasm is a spasm or tightening of the airways going into the lungs. During a bronchospasm breathing becomes more difficult because the airways get smaller. When this happens there can be coughing, a whistling sound when breathing (wheezing), and difficulty breathing. Bronchospasm is often associated with asthma, but not all patients who experience a bronchospasm have asthma. °CAUSES  °A bronchospasm is caused  by inflammation or irritation of the airways. The inflammation or irritation may be triggered by:  °· Allergies (such as to animals, pollen, food, or mold). Allergens that cause bronchospasm may cause wheezing immediately after exposure or many hours later.   °· Infection. Viral infections are believed to be the most common cause of bronchospasm.   °· Exercise.   °· Irritants (such as pollution, cigarette smoke, strong odors, aerosol sprays, and paint fumes).   °· Weather changes. Winds increase molds and pollens in the air. Rain refreshes the air by washing irritants out. Cold air may cause inflammation.   °· Stress and emotional upset.   °SIGNS AND SYMPTOMS  °· Wheezing.   °· Excessive nighttime coughing.   °· Frequent or severe coughing with a simple cold.   °· Chest tightness.   °· Shortness of breath.   °DIAGNOSIS  °Bronchospasm is usually diagnosed through a history and physical exam. Tests, such as   chest X-rays, are sometimes done to look for other conditions. °TREATMENT  °· Inhaled medicines can be given to open up your airways and help you breathe. The medicines can be given using either an inhaler or a nebulizer machine. °· Corticosteroid medicines may be given for severe bronchospasm, usually when it is associated with asthma. °HOME CARE INSTRUCTIONS  °· Always have a plan prepared for seeking medical care. Know when to call your health care provider and local emergency services (911 in the [place] Know where you can access local emergency care. °· Only take medicines as directed by your health care provider. °· If you were prescribed an inhaler or nebulizer machine, ask your health care provider to explain how to use it correctly. Always use a spacer with your inhaler if you were given one. °· It is necessary to remain calm during an attack. Try to relax and breathe more slowly.  °· Control your home environment in the following ways:   °· Change your heating and air conditioning filter at least once a  month.   °· Limit your use of fireplaces and wood stoves. °· Do not smoke and do not allow smoking in your home.   °· Avoid exposure to perfumes and fragrances.   °· Get rid of pests (such as roaches and mice) and their droppings.   °· Throw away plants if you see mold on them.   °· Keep your house clean and dust free.   °· Replace carpet with wood, tile, or vinyl flooring. Carpet can trap dander and dust.   °· Use allergy-proof pillows, mattress covers, and box spring covers.   °· Wash bed sheets and blankets every week in hot water and dry them in a dryer.   °· Use blankets that are made of polyester or cotton.   °· Wash hands frequently. °SEEK MEDICAL CARE IF:  °· You have muscle aches.   °· You have chest pain.   °· The sputum changes from clear or white to yellow, green, gray, or bloody.   °· The sputum you cough up gets thicker.   °· There are problems that may be related to the medicine you are given, such as a rash, itching, swelling, or trouble breathing.   °SEEK IMMEDIATE MEDICAL CARE IF:  °· You have worsening wheezing and coughing even after taking your prescribed medicines.   °· You have increased difficulty breathing.   °· You develop severe chest pain. °MAKE SURE YOU:  °· Understand these instructions. °· Will watch your condition. °· Will get help right away if you are not doing well or get worse. °Document Released: 11/17/2003 Document Revised: 07/17/2013 Document Reviewed: 05/06/2013 °[employer]® Patient Information ©2014 [employer]. ° °

## 2013-12-11 NOTE — ED Notes (Signed)
Pt states that he has been sick in the last 2 weeks. Took prednisone. Today he began working out and has now been SOB x 45 minutes. Does not appear to have labored breathing. 100% RA.

## 2013-12-11 NOTE — ED Provider Notes (Signed)
CSN: [id number]     Arrival date & time 12/11/13  1640 History  This chart was scribed for non-physician practitioner, Izola PriceFrances C. Marisue HumbleSanford, PA-C working with Rolland PorterMark James, MD by Greggory StallionKayla Andersen, ED scribe. This patient was seen in room WTR8/WTR8 and the patient's care was started at 4:56 PM.    Chief Complaint  Patient presents with  . Shortness of Breath   The history is provided by the patient. No language interpreter was used.   HPI Comments: Lee Stark is a 28 y.o. male who presents to the Emergency Department complaining of SOB with associated wheezing and chest tightness that started about 1 hour ago while warming up to work out. Pt finished a course of prednisone 1.5 weeks ago. Denies fever, chills. Denies history of exercise induced asthma.   No past medical history on file. No past surgical history on file. No family history on file. History  Substance Use Topics  . Smoking status: Current Every Day Smoker    Types: Cigarettes  . Smokeless tobacco: Not on file  . Alcohol Use: Yes     Comment: occ    Review of Systems  Constitutional: Negative for fever and chills.  Respiratory: Positive for chest tightness, shortness of breath and wheezing.   All other systems reviewed and are negative.   Allergies  Review of patient's allergies indicates no known allergies.  Home Medications   Current Outpatient Rx  Name  Route  Sig  Dispense  Refill  . albuterol (PROVENTIL HFA;VENTOLIN HFA) 108 (90 BASE) MCG/ACT inhaler   Inhalation   Inhale 2 puffs into the lungs every 4 (four) hours as needed for wheezing or shortness of breath (cough).   1 Inhaler   0   . Multiple Vitamin (MULTIVITAMIN WITH MINERALS) TABS tablet   Oral   Take 1 tablet by mouth daily.         . predniSONE (DELTASONE) 20 MG tablet   Oral   Take 3 tablets (60 mg total) by mouth daily.   15 tablet   0   . predniSONE (DELTASONE) 20 MG tablet   Oral   Take 1 tablet (20 mg total) by mouth daily with  breakfast.   4 tablet   0    BP 124/70  Pulse 83  Temp(Src) 99.1 F (37.3 C) (Oral)  Resp 20  SpO2 99%  Physical Exam  Nursing note and vitals reviewed. Constitutional: He is oriented to person, place, and time. He appears well-developed and well-nourished. No distress.  HENT:  Head: Normocephalic and atraumatic.  Right Ear: External ear normal.  Left Ear: External ear normal.  Nose: Nose normal.  Mouth/Throat: Oropharynx is clear and moist. No oropharyngeal exudate.  Eyes: Conjunctivae are normal. Pupils are equal, round, and reactive to light. No scleral icterus.  Neck: Normal range of motion. Neck supple.  Cardiovascular: Normal rate, regular rhythm and normal heart sounds.  Exam reveals no gallop and no friction rub.   No murmur heard. Pulmonary/Chest: Effort normal and breath sounds normal. No stridor. No respiratory distress. He has no wheezes. He has no rales. He exhibits no tenderness.  Abdominal: Soft. Bowel sounds are normal. He exhibits no distension. There is no tenderness.  Musculoskeletal: Normal range of motion. He exhibits no edema and no tenderness.  Lymphadenopathy:    He has no cervical adenopathy.  Neurological: He is alert and oriented to person, place, and time. He exhibits normal muscle tone. Coordination normal.  Skin: Skin is warm and dry. No  rash noted. No erythema. No pallor.  Psychiatric: He has a normal mood and affect. His behavior is normal. Judgment and thought content normal.    ED Course  Procedures (including critical care time)  DIAGNOSTIC STUDIES: Oxygen Saturation is 99% on RA, normal by my interpretation.    COORDINATION OF CARE: 5:00 PM-Discussed treatment plan which includes prednisone for the next 5 days with pt at bedside and pt agreed to plan. Advised pt to follow up with an allergy specialist if his symptoms do not resolve.   Labs Review Labs Reviewed - No data to display Imaging Review No results found.  EKG Interpretation    None       MDM  Reactive Airway disease  Patient here after starting workout with the sensation of shortness of breath, no stridor, accessory muscle use, wheezing noted here - left his inhaler in the EMS truck so we have started him back on prednisone and the inhaler.  Will refer to asthma and allergy specialist.  Izola Price. Marisue Humble, [place] 12/11/13 1802

## 2013-12-11 NOTE — ED Notes (Signed)
Pt ambulatory to exam room with steady gait.  

## 2013-12-11 NOTE — ED Notes (Signed)
Bed: WTR8 Expected date:  Expected time:  Means of arrival:  Comments: 28 y/o M, SOB while working out

## 2013-12-14 NOTE — ED Provider Notes (Signed)
Medical screening examination/treatment/procedure(s) were performed by non-physician practitioner and as supervising physician I was immediately available for consultation/collaboration.  EKG Interpretation   None         Romar Woodrick, MD 12/14/13 0707 

## 2014-03-01 ENCOUNTER — Encounter (HOSPITAL_COMMUNITY): Payer: Self-pay | Admitting: Emergency Medicine

## 2014-03-01 ENCOUNTER — Emergency Department (HOSPITAL_COMMUNITY)
Admission: EM | Admit: 2014-03-01 | Discharge: 2014-03-01 | Disposition: A | Discharge status: Home / Self Care | Payer: BC Managed Care – PPO | Attending: Emergency Medicine | Admitting: Emergency Medicine

## 2014-03-01 DIAGNOSIS — Z79899 Other long term (current) drug therapy: Secondary | ICD-10-CM | POA: Insufficient documentation

## 2014-03-01 DIAGNOSIS — J309 Allergic rhinitis, unspecified: Secondary | ICD-10-CM | POA: Insufficient documentation

## 2014-03-01 DIAGNOSIS — J302 Other seasonal allergic rhinitis: Secondary | ICD-10-CM

## 2014-03-01 DIAGNOSIS — F17200 Nicotine dependence, unspecified, uncomplicated: Secondary | ICD-10-CM | POA: Insufficient documentation

## 2014-03-01 DIAGNOSIS — IMO0002 Reserved for concepts with insufficient information to code with codable children: Secondary | ICD-10-CM | POA: Insufficient documentation

## 2014-03-01 MED ORDER — LORATADINE 10 MG PO TABS
10.0000 mg | ORAL_TABLET | Freq: Once | ORAL | Status: AC
  Administered 2014-03-01: 10 mg via ORAL
  Filled 2014-03-01: qty 1

## 2014-03-01 MED ORDER — DESLORATADINE 5 MG PO TABS: 5.0000 mg | ORAL_TABLET | Freq: Every day | ORAL | Status: AC

## 2014-03-01 MED ORDER — OXYMETAZOLINE HCL 0.05 % NA SOLN
2.0000 | Freq: Once | NASAL | Status: AC
  Administered 2014-03-01: 2 via NASAL
  Filled 2014-03-01: qty 15

## 2014-03-01 NOTE — ED Notes (Signed)
Bed: WA03 Expected date:  Expected time:  Means of arrival:  Comments: EMS 

## 2014-03-01 NOTE — ED Provider Notes (Signed)
CSN: [id number]     Arrival date & time 03/01/14  0113 History   First MD Initiated Contact with Patient 03/01/14 0118     Chief Complaint  Patient presents with  . Dysphagia     (Consider location/radiation/quality/duration/timing/severity/associated sxs/prior Treatment) HPI States she's had several weeks of nasal congestion, tearing, sneezing and throat tightness. He states while driving this evening a throat tightness became worse. He also had trouble swallowing. He pulled over and called EMS. He was given a breathing treatment in route. He states he now has some tightness but is improved. He has no shortness of breath. He has no wheezing. He's had a mild nonproductive cough. Patient says he has not been diagnosed with seasonal allergies. He has been treated multiple times in the emergency department for URI type symptoms. Denies any rash, chest pain, nausea or vomiting. He's had no fevers or chills. History reviewed. No pertinent past medical history. History reviewed. No pertinent past surgical history. History reviewed. No pertinent family history. History  Substance Use Topics  . Smoking status: Current Every Day Smoker    Types: Cigarettes  . Smokeless tobacco: Not on file  . Alcohol Use: Yes     Comment: occ    Review of Systems  Constitutional: Negative for fever and chills.  HENT: Positive for congestion, rhinorrhea, sneezing and trouble swallowing. Negative for sinus pressure, sore throat and voice change.   Respiratory: Negative for chest tightness and shortness of breath.   Cardiovascular: Negative for chest pain, palpitations and leg swelling.  Gastrointestinal: Negative for nausea, vomiting and abdominal pain.  Musculoskeletal: Negative for back pain, neck pain and neck stiffness.  Skin: Negative for rash and wound.  Neurological: Negative for dizziness, weakness, light-headedness, numbness and headaches.  All other systems reviewed and are  negative.      Allergies  Review of patient's allergies indicates no known allergies.  Home Medications   Current Outpatient Rx  Name  Route  Sig  Dispense  Refill  . albuterol (PROVENTIL HFA;VENTOLIN HFA) 108 (90 BASE) MCG/ACT inhaler   Inhalation   Inhale 2 puffs into the lungs every 4 (four) hours as needed for wheezing or shortness of breath (cough).   1 Inhaler   0   . Multiple Vitamin (MULTIVITAMIN WITH MINERALS) TABS tablet   Oral   Take 1 tablet by mouth daily.         . predniSONE (DELTASONE) 20 MG tablet   Oral   Take 3 tablets (60 mg total) by mouth daily with breakfast.   15 tablet   0    BP 158/87  Pulse 104  Temp(Src) 98.2 F (36.8 C) (Oral)  Resp 22  SpO2 100% Physical Exam  Nursing note and vitals reviewed. Constitutional: He is oriented to person, place, and time. He appears well-developed and well-nourished. No distress.  Patient is in no distress speaking in a clear voice. Tolerating secretions.  HENT:  Head: Normocephalic and atraumatic.  Mouth/Throat: Oropharynx is clear and moist.  Bilateral nasal congestion. Oropharynx is clear. Uvula is midline. No intraoral swelling.  Eyes: EOM are normal. Pupils are equal, round, and reactive to light.  Bilateral injected conjunctiva with clear tearing.  Neck: Normal range of motion. Neck supple. No tracheal deviation present.  Cardiovascular: Normal rate and regular rhythm.   Pulmonary/Chest: Effort normal and breath sounds normal. No stridor. No respiratory distress. He has no wheezes. He has no rales. He exhibits no tenderness.  Abdominal: Soft. Bowel sounds are normal. He  exhibits no distension and no mass. There is no tenderness. There is no rebound and no guarding.  Musculoskeletal: Normal range of motion. He exhibits no edema and no tenderness.  Neurological: He is alert and oriented to person, place, and time.  Moves all extremities without deficit. Sensation is grossly intact.  Skin: Skin is  warm and dry. No rash noted. No erythema.  Psychiatric: He has a normal mood and affect. His behavior is normal.    ED Course  Procedures (including critical care time) Labs Review Labs Reviewed - No data to display Imaging Review No results found.   EKG Interpretation None      MDM   Final diagnoses:  None    Patient with intact airway. This appears to be a flare of seasonal allergies. We'll treat symptomatically and reassess.  Patient states he is feeling much better after medication. Resting comfortably. He has no airway compromise. This appears to be a flare in the patient's seasonal allergies. Return precautions have been given and he has voiced understanding.  Loren Racer, MD 03/01/14 [phone]

## 2014-03-01 NOTE — Discharge Instructions (Signed)
Allergic Rhinitis Allergic rhinitis is when the mucous membranes in the nose respond to allergens. Allergens are particles in the air that cause your body to have an allergic reaction. This causes you to release allergic antibodies. Through a chain of events, these eventually cause you to release histamine into the blood stream. Although meant to protect the body, it is this release of histamine that causes your discomfort, such as frequent sneezing, congestion, and an itchy, runny nose.  CAUSES  Seasonal allergic rhinitis (hay fever) is caused by pollen allergens that may come from grasses, trees, and weeds. Year-round allergic rhinitis (perennial allergic rhinitis) is caused by allergens such as house dust mites, pet dander, and mold spores.  SYMPTOMS   Nasal stuffiness (congestion).  Itchy, runny nose with sneezing and tearing of the eyes. DIAGNOSIS  Your health care provider can help you determine the allergen or allergens that trigger your symptoms. If you and your health care provider are unable to determine the allergen, skin or blood testing may be used. TREATMENT  Allergic Rhinitis does not have a cure, but it can be controlled by:  Medicines and allergy shots (immunotherapy).  Avoiding the allergen. Hay fever may often be treated with antihistamines in pill or nasal spray forms. Antihistamines block the effects of histamine. There are over-the-counter medicines that may help with nasal congestion and swelling around the eyes. Check with your health care provider before taking or giving this medicine.  If avoiding the allergen or the medicine prescribed do not work, there are many new medicines your health care provider can prescribe. Stronger medicine may be used if initial measures are ineffective. Desensitizing injections can be used if medicine and avoidance does not work. Desensitization is when a patient is given ongoing shots until the body becomes less sensitive to the allergen.  Make sure you follow up with your health care provider if problems continue. HOME CARE INSTRUCTIONS It is not possible to completely avoid allergens, but you can reduce your symptoms by taking steps to limit your exposure to them. It helps to know exactly what you are allergic to so that you can avoid your specific triggers. SEEK MEDICAL CARE IF:   You have a fever.  You develop a cough that does not stop easily (persistent).  You have shortness of breath.  You start wheezing.  Symptoms interfere with normal daily activities. Document Released: 08/09/2001 Document Revised: 09/04/2013 Document Reviewed: 07/22/2013 [employer] Patient Information 2014 [employer].  

## 2014-03-01 NOTE — ED Notes (Addendum)
Pt pulled over on side of road w/ difficulty breathing. Took 3 puffs of pro air inhaler that he had from sickness earlier. Not asthmatic. States he feels like he cannot swallow at this time. Pt has smoked weed and ate several hours ago. No other s/s of possible 'allergic reaction.' Given 5mg  albuterol neb per request in truck. Ambulatory. Alert and oriented.

## 2014-10-07 ENCOUNTER — Encounter (HOSPITAL_COMMUNITY): Payer: Self-pay

## 2014-10-07 ENCOUNTER — Emergency Department (HOSPITAL_COMMUNITY)
Admission: EM | Admit: 2014-10-07 | Discharge: 2014-10-07 | Disposition: A | Discharge status: Home / Self Care | Payer: BC Managed Care – PPO | Attending: Emergency Medicine | Admitting: Emergency Medicine

## 2014-10-07 DIAGNOSIS — R369 Urethral discharge, unspecified: Secondary | ICD-10-CM

## 2014-10-07 DIAGNOSIS — Z720 Tobacco use: Secondary | ICD-10-CM | POA: Insufficient documentation

## 2014-10-07 DIAGNOSIS — Z79899 Other long term (current) drug therapy: Secondary | ICD-10-CM | POA: Insufficient documentation

## 2014-10-07 DIAGNOSIS — Z711 Person with feared health complaint in whom no diagnosis is made: Secondary | ICD-10-CM

## 2014-10-07 DIAGNOSIS — Z113 Encounter for screening for infections with a predominantly sexual mode of transmission: Secondary | ICD-10-CM | POA: Insufficient documentation

## 2014-10-07 LAB — URINALYSIS, ROUTINE W REFLEX MICROSCOPIC
BILIRUBIN URINE: NEGATIVE
Glucose, UA: NEGATIVE mg/dL
Hgb urine dipstick: NEGATIVE
KETONES UR: NEGATIVE mg/dL
LEUKOCYTES UA: NEGATIVE
Nitrite: NEGATIVE
Protein, ur: NEGATIVE mg/dL
SPECIFIC GRAVITY, URINE: 1.025 (ref 1.005–1.030)
UROBILINOGEN UA: 0.2 mg/dL (ref 0.0–1.0)
pH: 5.5 (ref 5.0–8.0)

## 2014-10-07 LAB — RPR

## 2014-10-07 LAB — HIV ANTIBODY (ROUTINE TESTING W REFLEX): HIV: NONREACTIVE

## 2014-10-07 MED ORDER — DEXTROSE 5 % IV SOLN: 1.0000 g | Freq: Once | INTRAVENOUS | Status: DC

## 2014-10-07 MED ORDER — AZITHROMYCIN 250 MG PO TABS
1000.0000 mg | ORAL_TABLET | Freq: Once | ORAL | Status: AC
  Administered 2014-10-07: 1000 mg via ORAL
  Filled 2014-10-07: qty 4

## 2014-10-07 MED ORDER — STERILE WATER FOR INJECTION IJ SOLN
INTRAMUSCULAR | Status: AC
  Administered 2014-10-07: 10 mL
  Filled 2014-10-07: qty 10

## 2014-10-07 MED ORDER — CEFTRIAXONE SODIUM 250 MG IJ SOLR
250.0000 mg | Freq: Once | INTRAMUSCULAR | Status: AC
  Administered 2014-10-07: 250 mg via INTRAMUSCULAR
  Filled 2014-10-07: qty 250

## 2014-10-07 NOTE — ED Notes (Signed)
States a week ago was told by girl she had trich, was seen by MD and was given medication and treated for trich.  States  He started having penile discharge 5 days ago with burning with uriatation

## 2014-10-07 NOTE — ED Notes (Signed)
Pt started having discharge about 5 days ago. Has been to MD and had urine and blood work checked. Pt still having irritation and the discharge and burning with urination.

## 2014-10-07 NOTE — Discharge Instructions (Signed)
1. Medications: usual home medications 2. Treatment: rest, drink plenty of fluids, use a condom with every sexual encounter 3. Follow Up: Please followup with your primary doctor in 3-5 days for discussion of your diagnoses and further evaluation after today's visit; if you do not have a primary care doctor use the resource guide provided to find one; Please return to the ER for worsening symptoms, abd pain, fevers or other concerns  Chlamydia Chlamydia is an infection. It is spread through sexual contact. Chlamydia can be in different areas of the body. These areas include the urethra, throat, or rectum. It is important to treat chlamydia as soon as possible. It can damage other organs.  CAUSES  Chlamydia is caused by bacteria. It is a sexually transmitted disease. This means that it is passed from an infected partner during intimate contact. This contact could be with the genitals, mouth, or rectal area.  SIGNS AND SYMPTOMS  There may not be any symptoms. This is often the case early in the infection. If there are symptoms, they are usually mild and may only be noticeable in the morning. Symptoms you may notice include:   Burning with urination.  Pain or swelling in the testicles.  Watery mucus-like discharge from the penis.  Long-standing (chronic) pelvic pain after frequent infections.  Pain, swelling, or itching around the anus.  A sore throat.  Itching, burning, or redness in the eyes, or discharge from the eyes. DIAGNOSIS  To diagnose this infection, your health care provider will do a pelvic exam. A sample of urine or a swab from the rectum may be taken for testing.  TREATMENT  Chlamydia is treated with antibiotic medicines.  HOME CARE INSTRUCTIONS  Take your antibiotic medicine as directed by your health care provider. Finish the antibiotic even if you start to feel better. Incomplete treatment will put you at risk for not being able to have children (sterility).   Take  medicines only as directed by your health care provider.   Rest.   Inform any sexual partners about your infection. Even if they are symptom free or have a negative culture or evaluation, they should be treated for the condition.   Do not have sex (intercourse) until treatment is completed and your health care provider says it is okay.   Keep all follow-up visits as directed by your health care provider.   Not all test results are available during your visit. If your test results are not back during the visit, make an appointment with your health care provider to find out the results. Do not assume everything is normal if you have not heard from your health care provider or the medical facility. It is your responsibility to get your test results. SEEK MEDICAL CARE IF:  You develop new joint pain.  You have a fever. SEEK IMMEDIATE MEDICAL CARE IF:   Your pain increases.   You have abnormal discharge.   You have pain during intercourse. MAKE SURE YOU:   Understand these instructions.  Will watch your condition.  Will get help right away if you are not doing well or get worse. Document Released: 11/14/2005 Document Revised: 03/31/2014 Document Reviewed: 05/23/2013 [facility] Patient Information 2015 [place], [place]. This information is not intended to replace advice given to you by your health care provider. Make sure you discuss any questions you have with your health care provider.

## 2014-10-07 NOTE — ED Provider Notes (Signed)
CSN: [id number]     Arrival date & time 10/07/14  [id number] History   First MD Initiated Contact with Patient 10/07/14 1027     Chief Complaint  Patient presents with  . Penile Discharge     (Consider location/radiation/quality/duration/timing/severity/associated sxs/prior Treatment) Patient is a 28 y.o. male presenting with penile discharge. The history is provided by the patient and medical records. No language interpreter was used.  Penile Discharge Pertinent negatives include no abdominal pain, chest pain, coughing, diaphoresis, fatigue, fever, headaches, nausea, rash or vomiting.     Barney DrainBrian Polendo is a 28 y.o. male  with no major medical Hx presents to the Emergency Department complaining of gradual, persistent, progressively worsening penile discharge onset 5 days ago.  Pt reports his girlfriend informed him that she had Trich 2 weeks ago.  Patient reports his discharge He was seen at urgent care and treated for this.  He reports his other STD tests were negative at that time, but he does not remember what he was tested for.  He denies new sexual contacts. Associated symptoms include dysuria, urethral "irritation" with intermittent stabbing pain in his urethra.  Nothing makes it better and nothing makes it worse.  Pt denies fever, chills, headache, neck pain, chest pain, SOB, abd pain, N/V/D, weakness, dizziness, penile pain, testicular pain, pain with defecation, bloody discharge.     History reviewed. No pertinent past medical history. History reviewed. No pertinent past surgical history. History reviewed. No pertinent family history. History  Substance Use Topics  . Smoking status: Current Every Day Smoker    Types: Cigarettes  . Smokeless tobacco: Not on file  . Alcohol Use: Yes     Comment: occ    Review of Systems  Constitutional: Negative for fever, diaphoresis, appetite change, fatigue and unexpected weight change.  HENT: Negative for mouth sores.   Eyes: Negative for visual  disturbance.  Respiratory: Negative for cough, chest tightness, shortness of breath and wheezing.   Cardiovascular: Negative for chest pain.  Gastrointestinal: Negative for nausea, vomiting, abdominal pain, diarrhea and constipation.  Endocrine: Negative for polydipsia, polyphagia and polyuria.  Genitourinary: Positive for dysuria and discharge. Negative for urgency, frequency and hematuria.  Musculoskeletal: Negative for back pain and neck stiffness.  Skin: Negative for rash.  Allergic/Immunologic: Negative for immunocompromised state.  Neurological: Negative for syncope, light-headedness and headaches.  Hematological: Does not bruise/bleed easily.  Psychiatric/Behavioral: Negative for sleep disturbance. The patient is not nervous/anxious.       Allergies  Review of patient's allergies indicates no known allergies.  Home Medications   Prior to Admission medications   Medication Sig Start Date End Date Taking? Authorizing Provider  Multiple Vitamin (MULTIVITAMIN WITH MINERALS) TABS tablet Take 1 tablet by mouth daily.   Yes Historical Provider, MD  albuterol (PROVENTIL HFA;VENTOLIN HFA) 108 (90 BASE) MCG/ACT inhaler Inhale 2 puffs into the lungs every 4 (four) hours as needed for wheezing or shortness of breath (cough). 11/19/13   Jennifer L Piepenbrink, PA-C  desloratadine (CLARINEX) 5 MG tablet Take 1 tablet (5 mg total) by mouth daily. Patient not taking: Reported on 10/07/2014 03/01/14   Loren Raceravid Yelverton, MD   BP 117/81 mmHg  Pulse 55  Temp(Src) 97.9 F (36.6 C) (Oral)  Resp 18  SpO2 97% Physical Exam  Constitutional: He appears well-developed and well-nourished. No distress.  Awake, alert, nontoxic appearance  HENT:  Head: Normocephalic and atraumatic.  Mouth/Throat: Oropharynx is clear and moist. No oropharyngeal exudate.  Eyes: Conjunctivae are normal. No scleral icterus.  Neck: Normal range of motion. Neck supple.  Cardiovascular: Normal rate, regular rhythm, normal  heart sounds and intact distal pulses.   No murmur heard. Pulmonary/Chest: Effort normal and breath sounds normal. No respiratory distress. He has no wheezes.  Equal chest expansion  Abdominal: Soft. Bowel sounds are normal. He exhibits no mass. There is no tenderness. There is no rebound and no guarding. Hernia confirmed negative in the right inguinal area and confirmed negative in the left inguinal area.  Abdomen soft and nontender  Genitourinary: Testes normal. Cremasteric reflex is present. Right testis shows no mass, no swelling and no tenderness. Right testis is descended. Cremasteric reflex is not absent on the right side. Left testis shows no mass, no swelling and no tenderness. Left testis is descended. Cremasteric reflex is not absent on the left side. Circumcised. No phimosis, paraphimosis, hypospadias, penile erythema or penile tenderness. Discharge (clear) found.  Clear discharge noted at the urethral meatus No lesions visible No pain to palpation of the testicles or epididymis No hernia or lymphadenopathy   Musculoskeletal: Normal range of motion. He exhibits no edema.  Lymphadenopathy:       Right: No inguinal adenopathy present.       Left: No inguinal adenopathy present.  Neurological: He is alert.  Speech is clear and goal oriented Moves extremities without ataxia  Skin: Skin is warm and dry. He is not diaphoretic.  Psychiatric: He has a normal mood and affect.  Nursing note and vitals reviewed.   ED Course  Procedures (including critical care time) Labs Review Labs Reviewed  GC/CHLAMYDIA PROBE AMP  URINE CULTURE  URINALYSIS, ROUTINE W REFLEX MICROSCOPIC  RPR  HIV ANTIBODY (ROUTINE TESTING)    Imaging Review No results found.   EKG Interpretation None      MDM   Final diagnoses:  Concern about STD in male without diagnosis  Penile discharge   Barney DrainBrian Candela presents with penile discharge several weeks after being treated for trichomonas.  At that time  patient was treated for trichomonas alone and not treated for gonorrhea or chlamydia. Patient without tenderness to testes or epididymis; no concern for orchitis or epididymitis.  Patient abdomen soft and nontender and no CVA tenderness to indicate pyelonephritis or cystitis. Patient denies painful defecation, is afebrile and not tachycardic. Doubt proctitis.  Likely patient with false-negative of a chlamydia test. We'll treat for gonorrhea and chlamydia here. Patient will also be tested for HIV and syphilis. He is to follow-up his primary care physician within one week. He's been instructed to refrain from sexual intercourse for a minimum of one week after treatment. He's to return to the emergency department if symptoms persist.  I have personally reviewed patient's vitals, nursing note and any pertinent labs or imaging.  I performed an undressed physical exam.    It has been determined that no acute conditions requiring further emergency intervention are present at this time. The patient/guardian have been advised of the diagnosis and plan. I reviewed all labs and imaging including any potential incidental findings. We have discussed signs and symptoms that warrant return to the ED and they are listed in the discharge instructions.    Vital signs are stable at discharge.   BP 117/81 mmHg  Pulse 55  Temp(Src) 97.9 F (36.6 C) (Oral)  Resp 18  SpO2 97%        Dierdre ForthHannah Ranetta Armacost, PA-C 10/07/14 1141  Audree CamelScott T Goldston, MD 10/10/14 1624

## 2014-10-08 LAB — URINE CULTURE
COLONY COUNT: NO GROWTH
Culture: NO GROWTH

## 2014-10-08 LAB — GC/CHLAMYDIA PROBE AMP
CT Probe RNA: NEGATIVE
GC PROBE AMP APTIMA: NEGATIVE

## 2014-11-06 IMAGING — CR DG CHEST 2V
2 series · 2 of 2 positions shown · non-contrast
Comparison: None.

CLINICAL DATA: Shortness of breath, cough, smoker

EXAM:
CHEST  2 VIEW

[w chest pa]
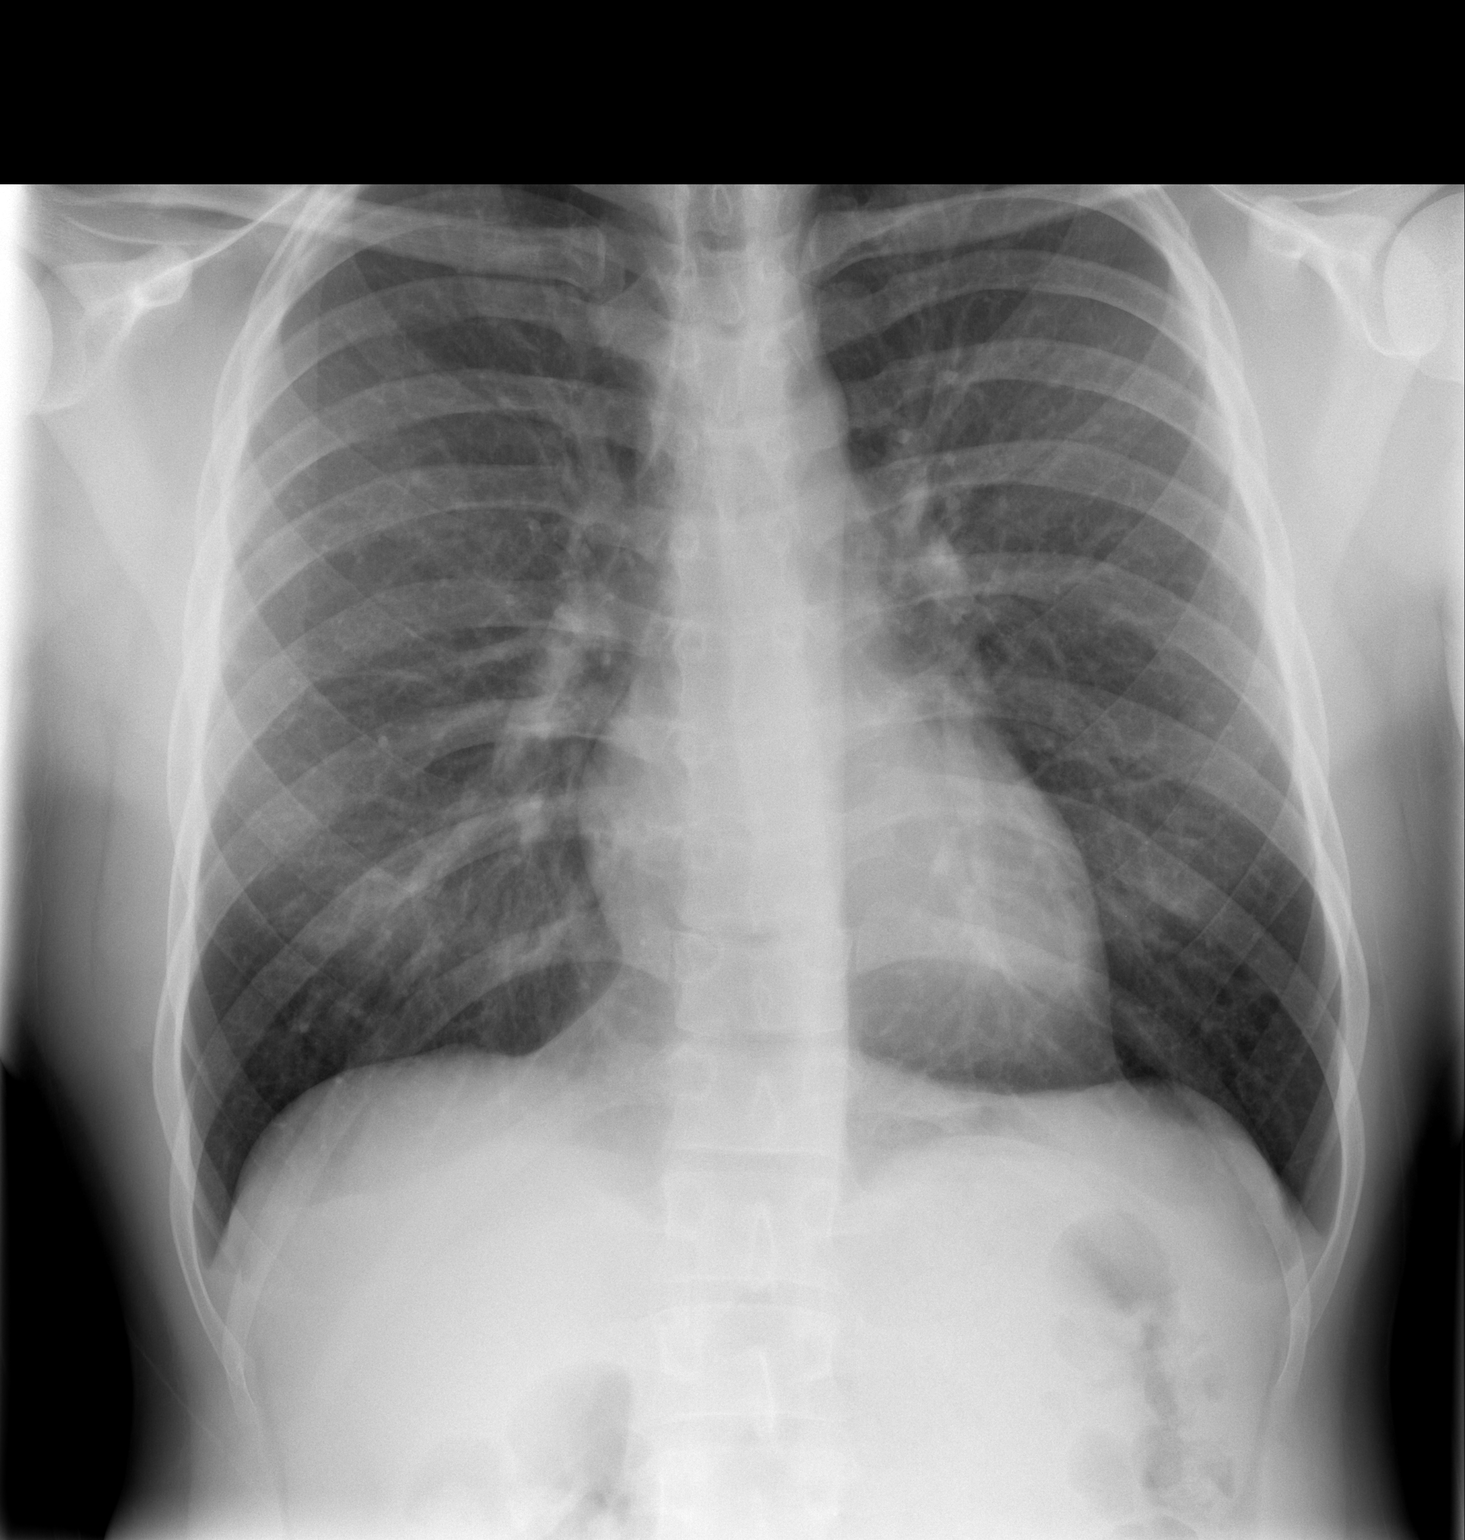

[w chest lat]
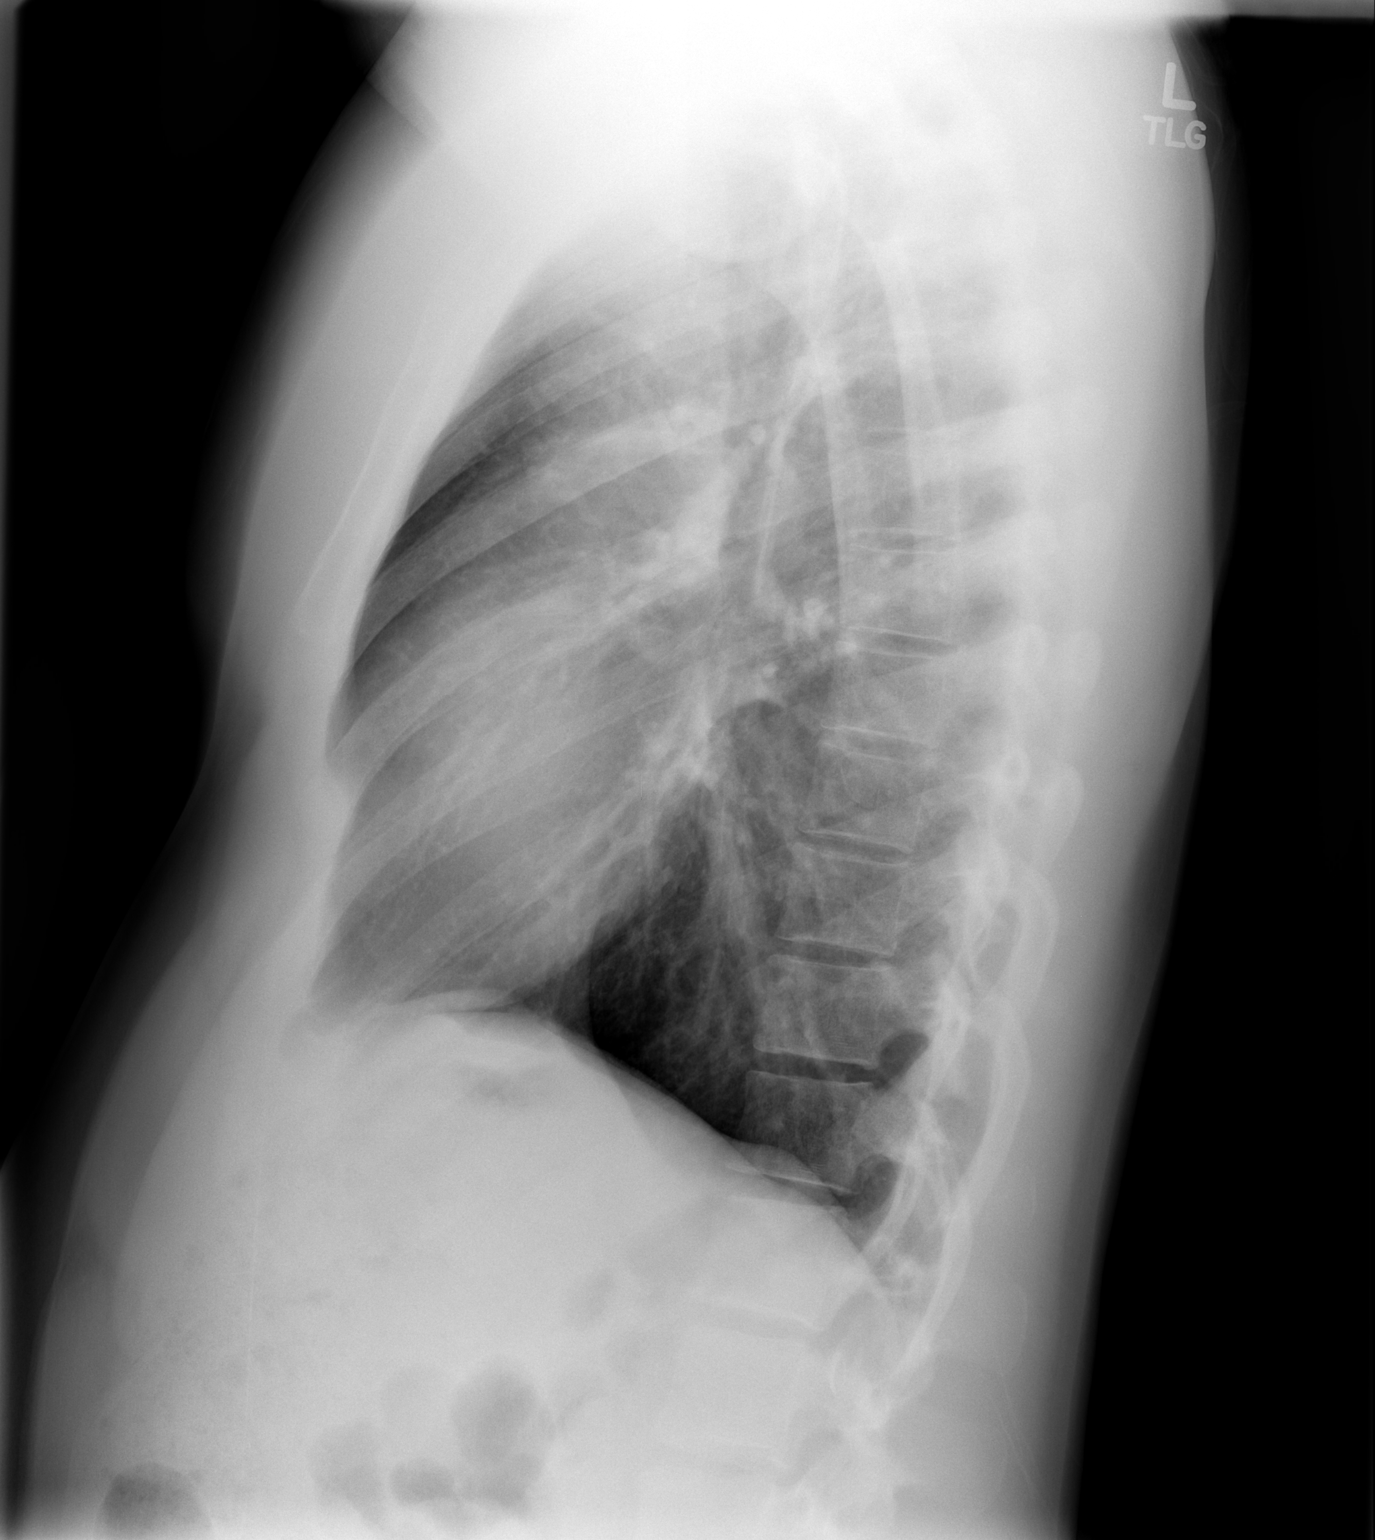

[2 of 2 positions shown; findings below may reference images not displayed]

FINDINGS: The heart size and mediastinal contours are within normal limits.
Both lungs are clear. The visualized skeletal structures are
unremarkable.
IMPRESSION: No active cardiopulmonary disease.
